# Patient Record
Sex: Female | Born: 1995 | Race: White | Hispanic: No | Marital: Single | Smoking: Current some day smoker
Health system: Southern US, Community
[De-identification: ages and names within clinical notes are randomized; demographics above are authoritative.]

## PROBLEM LIST (undated history)

## (undated) ENCOUNTER — Inpatient Hospital Stay (HOSPITAL_COMMUNITY): Payer: Self-pay

## (undated) DIAGNOSIS — F419 Anxiety disorder, unspecified: Secondary | ICD-10-CM

## (undated) DIAGNOSIS — B009 Herpesviral infection, unspecified: Secondary | ICD-10-CM

## (undated) DIAGNOSIS — F3110 Bipolar disorder, current episode manic without psychotic features, unspecified: Secondary | ICD-10-CM

## (undated) HISTORY — DX: Anxiety disorder, unspecified: F41.9

## (undated) HISTORY — DX: Bipolar disorder, current episode manic without psychotic features, unspecified: F31.10

---

## 2017-03-15 ENCOUNTER — Inpatient Hospital Stay (HOSPITAL_COMMUNITY)
Admission: AD | Admit: 2017-03-15 | Discharge: 2017-03-15 | Disposition: A | Discharge status: Home / Self Care | Payer: Medicaid Other | Source: Ambulatory Visit | Attending: Obstetrics & Gynecology | Admitting: Obstetrics & Gynecology

## 2017-03-15 ENCOUNTER — Encounter (HOSPITAL_COMMUNITY): Payer: Self-pay | Admitting: Medical

## 2017-03-15 DIAGNOSIS — R110 Nausea: Secondary | ICD-10-CM | POA: Insufficient documentation

## 2017-03-15 DIAGNOSIS — Z3201 Encounter for pregnancy test, result positive: Secondary | ICD-10-CM | POA: Insufficient documentation

## 2017-03-15 DIAGNOSIS — R1010 Upper abdominal pain, unspecified: Secondary | ICD-10-CM | POA: Diagnosis not present

## 2017-03-15 DIAGNOSIS — R1111 Vomiting without nausea: Secondary | ICD-10-CM | POA: Diagnosis not present

## 2017-03-15 LAB — POCT PREGNANCY, URINE: Preg Test, Ur: POSITIVE — AB

## 2017-03-15 MED ORDER — PROMETHAZINE HCL 12.5 MG PO TABS: 12.5000 mg | ORAL_TABLET | Freq: Four times a day (QID) | ORAL | 0 refills | Status: DC | PRN

## 2017-03-15 NOTE — Discharge Instructions (Signed)
Eating Plan for Nausea and Vomiting in Pregnancy Hyperemesis gravidarum is a severe form of morning sickness. Because this condition causes severe nausea and vomiting, it can lead to dehydration, malnutrition, and weight loss. One way to lessen the symptoms of nausea and vomiting is to follow the eating plan for hyperemesis gravidarum. It is often used along with prescribed medicines to control your symptoms. What can I do to relieve my symptoms? Listen to your body. Everyone is different and has different preferences. Find what works best for you. Take any of the following actions that are helpful to you:  Eat and drink slowly.  Eat 5-6 small meals daily instead of 3 large meals.  Eat crackers before you get out of bed in the morning.  Try having a snack in the middle of the night.  Starchy foods are usually tolerated well. Examples include cereal, toast, bread, potatoes, pasta, rice, and pretzels.  Ginger may help with nausea. Add  tsp ground ginger to hot tea or choose ginger tea.  Try drinking 100% fruit juice or an electrolyte drink. An electrolyte drink contains sodium, potassium, and chloride.  Continue to take your prenatal vitamins as told by your health care provider. If you are having trouble taking your prenatal vitamins, talk with your health care provider about different options.  Include at least 1 serving of protein with your meals and snacks. Protein options include meats or poultry, beans, nuts, eggs, and yogurt. Try eating a protein-rich snack before bed. Examples of these snacks include cheese and crackers or half of a peanut butter or [place] sandwich.  Consider eliminating foods that trigger your symptoms. These may include spicy foods, coffee, high-fat foods, very sweet foods, and acidic foods.  Try meals that have more protein combined with bland, salty, lower-fat, and dry foods, such as nuts, seeds, pretzels, crackers, and cereal.  Talk with your healthcare  provider about starting a supplement of vitamin B6.  Have fluids that are cold, clear, and carbonated or sour. Examples include lemonade, ginger ale, lemon-lime soda, ice water, and sparkling water.  Try lemon or mint tea.  Try brushing your teeth or using a mouth rinse after meals. What should I avoid to reduce my symptoms? Avoiding some of the following things may help reduce your symptoms.  Foods with strong smells. Try eating meals in well-ventilated areas that are free of odors.  Drinking water or other beverages with meals. Try not to drink anything during the 30 minutes before and after your meals.  Drinking more than 1 cup of fluid at a time. Sometimes using a straw helps.  Fried or high-fat foods, such as butter and cream sauces.  Spicy foods.  Skipping meals as best as you can. Nausea can be more intense on an empty stomach. If you cannot tolerate food at that time, do not force it. Try sucking on ice chips or other frozen items, and make up for missed calories later.  Lying down within 2 hours after eating.  Environmental triggers. These may include smoky rooms, closed spaces, rooms with strong smells, warm or humid places, overly loud and noisy rooms, and rooms with motion or flickering lights.  Quick and sudden changes in your movement. This information is not intended to replace advice given to you by your health care provider. Make sure you discuss any questions you have with your health care provider. Document Released: 08/25/2007 Document Revised: 06/26/2016 Document Reviewed: 05/28/2016 Elsevier Interactive Patient Education  2017 [employer]  For the  nausea you can take an over the counter medication known as UNISOM in combination with VITAMIN B6 50mg  twice a day. This is the same as a prescription medication known as Diclegis, and is the only known safe medication to take in early pregnancy. There are other medications available if this does now work. However,  those medications are not known to be as safe.

## 2017-03-15 NOTE — MAU Note (Signed)
Patient has had 4 positive pregnancy tests, denies vaginal bleeding, no unusual discharge, very nauseous, LMP 02/11/17, denies pain.

## 2017-03-15 NOTE — MAU Provider Note (Signed)
  History     CSN: [id number]  Arrival date and time: 03/15/17 1327    First Provider Initiated Contact with Patient 03/15/17 1349        Chief Complaint  Patient presents with  . Possible Pregnancy   HPI Ms. Jackie PlumCarley Morillo is a 21 y.o.  who presents to MAU today with complaint of nausea without vomiting. The patient also states mild intermittent associated upper abdominal pain. She denies lower abdominal pain, vaginal bleeding, discharge, fever, diarrhea or constipation today.   OB History    Gravida Para Term Preterm AB Living   1             SAB TAB Ectopic Multiple Live Births                  No past medical history on file.  No past surgical history on file.  No family history on file.  Social History  Substance Use Topics  . Smoking status: Not on file  . Smokeless tobacco: Not on file  . Alcohol use Not on file    Allergies: Allergies not on file  No prescriptions prior to admission.    Review of Systems  Constitutional: Negative for fever.  Gastrointestinal: Positive for abdominal pain and nausea. Negative for constipation, diarrhea and vomiting.  Genitourinary: Negative for vaginal bleeding and vaginal discharge.   Physical Exam   Blood pressure 111/85, pulse 75, temperature 97.8 F (36.6 C), temperature source Oral, resp. rate 16, height 5\' 7"  (1.702 m), weight 108 lb 1.9 oz (49 kg), last menstrual period 02/11/2017.  Physical Exam  Nursing note and vitals reviewed. Constitutional: She is oriented to person, place, and time. She appears well-developed and well-nourished. No distress.  HENT:  Head: Normocephalic and atraumatic.  Cardiovascular: Normal rate.   Respiratory: Effort normal.  GI: Soft. She exhibits no distension and no mass. There is no tenderness. There is no rebound and no guarding.  Neurological: She is alert and oriented to person, place, and time.  Skin: Skin is warm and dry. No erythema.  Psychiatric: She has a normal mood and  affect.    Results for orders placed or performed during the hospital encounter of 03/15/17 (from the past 24 hour(s))  Pregnancy, urine POC     Status: Abnormal   Collection Time: 03/15/17  1:53 PM  Result Value Ref Range   Preg Test, Ur POSITIVE (A) NEGATIVE    MAU Course  Procedures None  MDM UPT- negative   Assessment and Plan  A: Positive pregnancy test Nausea without vomiting  P: Discharge home Rx for Phenergan given to patient  OTC Unisom and B6 instructions given  First trimester precautions discussed Patient advised to follow-up with OB provider of choice.  Pregnancy confirmation letter given with list of OB providers Patient may return to MAU as needed or if her condition were to change or worsen   Vonzella NippleJulie Wenzel, PA-C 03/15/2017, 2:13 PM

## 2017-04-28 ENCOUNTER — Other Ambulatory Visit (HOSPITAL_COMMUNITY)
Admission: RE | Admit: 2017-04-28 | Discharge: 2017-04-28 | Disposition: A | Discharge status: Home / Self Care | Payer: Medicaid Other | Source: Ambulatory Visit | Attending: Obstetrics & Gynecology | Admitting: Obstetrics & Gynecology

## 2017-04-28 ENCOUNTER — Encounter: Payer: Self-pay | Admitting: *Deleted

## 2017-04-28 ENCOUNTER — Encounter: Payer: Self-pay | Admitting: Obstetrics & Gynecology

## 2017-04-28 ENCOUNTER — Ambulatory Visit (INDEPENDENT_AMBULATORY_CARE_PROVIDER_SITE_OTHER): Payer: Medicaid Other | Admitting: Obstetrics & Gynecology

## 2017-04-28 DIAGNOSIS — Z3400 Encounter for supervision of normal first pregnancy, unspecified trimester: Secondary | ICD-10-CM | POA: Diagnosis not present

## 2017-04-28 DIAGNOSIS — Z3401 Encounter for supervision of normal first pregnancy, first trimester: Secondary | ICD-10-CM

## 2017-04-28 NOTE — Progress Notes (Signed)
NOB here with boyfriend.  Bedside U/S  Shows IUP with FHT of 182 BPM  CRL is31.473mm and GA is 10w

## 2017-04-28 NOTE — Progress Notes (Signed)
  Subjective:    Lauren Lawson is a G1P0 5624w6d being seen today for her first obstetrical visit.  Her obstetrical history is significant for none. Patient does intend to breast feed. Pregnancy history fully reviewed.  Patient reports no complaints.  Vitals:   04/28/17 0933  BP: 107/81  Pulse: 92  Weight: 111 lb (50.3 kg)    HISTORY: OB History  Gravida Para Term Preterm AB Living  1            SAB TAB Ectopic Multiple Live Births               # Outcome Date GA Lbr Len/2nd Weight Sex Delivery Anes PTL Lv  1 Current              Past Medical History:  Diagnosis Date  . Anxiety   . Manic bipolar I disorder (HCC)    History reviewed. No pertinent surgical history. Family History  Problem Relation Age of Onset  . Hypertension Father      Exam    Uterus:     Pelvic Exam:    Perineum: No Hemorrhoids   Vulva: normal   Vagina:  normal mucosa   pH:    Cervix: anteverted   Adnexa: normal adnexa   Bony Pelvis: android  System: Breast:  normal appearance, no masses or tenderness   Skin: normal coloration and turgor, no rashes    Neurologic: oriented   Extremities: normal strength, tone, and muscle mass   HEENT PERRLA   Mouth/Teeth mucous membranes moist, pharynx normal without lesions   Neck supple   Cardiovascular: regular rate and rhythm   Respiratory:  appears well, vitals normal, no respiratory distress, acyanotic, normal RR, ear and throat exam is normal, neck free of mass or lymphadenopathy, chest clear, no wheezing, crepitations, rhonchi, normal symmetric air entry   Abdomen: soft, non-tender; bowel sounds normal; no masses,  no organomegaly   Urinary: urethral meatus normal      Assessment:    Pregnancy: G1P0 Patient Active Problem List   Diagnosis Date Noted  . Pregnancy, supervision of first, unspecified trimester 04/28/2017        Plan:     Initial labs drawn. Prenatal vitamins. Problem list reviewed and updated. Genetic Screening discussed  Quad Screen: undecided.  Ultrasound discussed; fetal survey: requested. And ordered  Follow up in 4 weeks.  Allie BossierMyra C Sutton Hirsch 04/28/2017

## 2017-04-29 LAB — CULTURE, OB URINE

## 2017-04-29 LAB — PRENATAL PROFILE (SOLSTAS)
ANTIBODY SCREEN: NEGATIVE
BASOS PCT: 0 %
Basophils Absolute: 0 cells/uL (ref 0–200)
EOS PCT: 1 %
Eosinophils Absolute: 56 cells/uL (ref 15–500)
HEMATOCRIT: 40.9 % (ref 35.0–45.0)
HEMOGLOBIN: 13.3 g/dL (ref 11.7–15.5)
HIV 1&2 Ab, 4th Generation: NONREACTIVE
Hepatitis B Surface Ag: NEGATIVE
LYMPHS ABS: 1064 {cells}/uL (ref 850–3900)
Lymphocytes Relative: 19 %
MCH: 28.9 pg (ref 27.0–33.0)
MCHC: 32.5 g/dL (ref 32.0–36.0)
MCV: 88.7 fL (ref 80.0–100.0)
MONOS PCT: 5 %
MPV: 9 fL (ref 7.5–12.5)
Monocytes Absolute: 280 cells/uL (ref 200–950)
NEUTROS ABS: 4200 {cells}/uL (ref 1500–7800)
Neutrophils Relative %: 75 %
Platelets: 236 10*3/uL (ref 140–400)
RBC: 4.61 MIL/uL (ref 3.80–5.10)
RDW: 14.2 % (ref 11.0–15.0)
Rh Type: POSITIVE
Rubella: 3.84 Index — ABNORMAL HIGH (ref <0.90)
WBC: 5.6 10*3/uL (ref 3.8–10.8)

## 2017-04-30 LAB — CYTOLOGY - PAP
Chlamydia: NEGATIVE
DIAGNOSIS: NEGATIVE
Neisseria Gonorrhea: NEGATIVE

## 2017-05-26 ENCOUNTER — Ambulatory Visit (INDEPENDENT_AMBULATORY_CARE_PROVIDER_SITE_OTHER): Payer: Medicaid Other | Admitting: Advanced Practice Midwife

## 2017-05-26 VITALS — BP 108/69 | HR 78 | Wt 115.0 lb

## 2017-05-26 DIAGNOSIS — H66001 Acute suppurative otitis media without spontaneous rupture of ear drum, right ear: Secondary | ICD-10-CM

## 2017-05-26 DIAGNOSIS — F419 Anxiety disorder, unspecified: Secondary | ICD-10-CM

## 2017-05-26 DIAGNOSIS — Z3A18 18 weeks gestation of pregnancy: Secondary | ICD-10-CM

## 2017-05-26 DIAGNOSIS — Z3400 Encounter for supervision of normal first pregnancy, unspecified trimester: Secondary | ICD-10-CM

## 2017-05-26 DIAGNOSIS — O99342 Other mental disorders complicating pregnancy, second trimester: Secondary | ICD-10-CM

## 2017-05-26 DIAGNOSIS — Z1379 Encounter for other screening for genetic and chromosomal anomalies: Secondary | ICD-10-CM

## 2017-05-26 DIAGNOSIS — R070 Pain in throat: Secondary | ICD-10-CM

## 2017-05-26 DIAGNOSIS — Z3402 Encounter for supervision of normal first pregnancy, second trimester: Secondary | ICD-10-CM

## 2017-05-26 MED ORDER — AMOXICILLIN 500 MG PO CAPS: 500.0000 mg | ORAL_CAPSULE | Freq: Two times a day (BID) | ORAL | 0 refills | Status: AC

## 2017-05-26 MED ORDER — AMOXICILLIN 500 MG PO CAPS: 500.0000 mg | ORAL_CAPSULE | Freq: Two times a day (BID) | ORAL | 0 refills | Status: DC
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
## 2017-05-26 NOTE — Addendum Note (Signed)
Addended by: Sharen CounterLEFTWICH-KIRBY, Koua Deeg A on: 05/26/2017 11:09 AM   Modules accepted: Orders

## 2017-05-26 NOTE — Patient Instructions (Signed)

## 2017-06-30 ENCOUNTER — Other Ambulatory Visit: Payer: Self-pay | Admitting: Advanced Practice Midwife

## 2017-06-30 ENCOUNTER — Ambulatory Visit (HOSPITAL_COMMUNITY)
Admission: RE | Admit: 2017-06-30 | Discharge: 2017-06-30 | Disposition: A | Discharge status: Home / Self Care | Payer: Medicaid Other | Source: Ambulatory Visit | Attending: Advanced Practice Midwife | Admitting: Advanced Practice Midwife

## 2017-06-30 DIAGNOSIS — Z3A19 19 weeks gestation of pregnancy: Secondary | ICD-10-CM | POA: Diagnosis not present

## 2017-06-30 DIAGNOSIS — Z3689 Encounter for other specified antenatal screening: Secondary | ICD-10-CM | POA: Diagnosis not present

## 2017-06-30 DIAGNOSIS — Z1379 Encounter for other screening for genetic and chromosomal anomalies: Secondary | ICD-10-CM

## 2017-06-30 DIAGNOSIS — Z3A18 18 weeks gestation of pregnancy: Secondary | ICD-10-CM

## 2017-07-04 ENCOUNTER — Ambulatory Visit (INDEPENDENT_AMBULATORY_CARE_PROVIDER_SITE_OTHER): Payer: Medicaid Other | Admitting: Certified Nurse Midwife

## 2017-07-04 ENCOUNTER — Encounter (INDEPENDENT_AMBULATORY_CARE_PROVIDER_SITE_OTHER): Payer: Self-pay

## 2017-07-04 VITALS — BP 101/69 | HR 83 | Wt 119.0 lb

## 2017-07-04 DIAGNOSIS — F32A Depression, unspecified: Secondary | ICD-10-CM

## 2017-07-04 DIAGNOSIS — F411 Generalized anxiety disorder: Secondary | ICD-10-CM

## 2017-07-04 DIAGNOSIS — O99342 Other mental disorders complicating pregnancy, second trimester: Secondary | ICD-10-CM

## 2017-07-04 DIAGNOSIS — Z3402 Encounter for supervision of normal first pregnancy, second trimester: Secondary | ICD-10-CM

## 2017-07-04 DIAGNOSIS — R319 Hematuria, unspecified: Secondary | ICD-10-CM

## 2017-07-04 DIAGNOSIS — Z3400 Encounter for supervision of normal first pregnancy, unspecified trimester: Secondary | ICD-10-CM

## 2017-07-04 DIAGNOSIS — O99340 Other mental disorders complicating pregnancy, unspecified trimester: Secondary | ICD-10-CM | POA: Insufficient documentation

## 2017-07-04 DIAGNOSIS — F329 Major depressive disorder, single episode, unspecified: Secondary | ICD-10-CM

## 2017-07-04 DIAGNOSIS — F419 Anxiety disorder, unspecified: Secondary | ICD-10-CM | POA: Insufficient documentation

## 2017-07-04 MED ORDER — SERTRALINE HCL 25 MG PO TABS: 25.0000 mg | ORAL_TABLET | Freq: Every day | ORAL | 1 refills | Status: DC

## 2017-07-04 NOTE — Progress Notes (Signed)
Urine-mod blood

## 2017-07-04 NOTE — Addendum Note (Signed)
Addended by: Granville Lewis on: 07/04/2017 11:52 AM   Modules accepted: Orders

## 2017-07-04 NOTE — Progress Notes (Signed)
Subjective:  Lauren Lawson is a 21 y.o. G1P0 at [redacted]w[redacted]d being seen today for ongoing prenatal care.  She is currently monitored for the following issues for this low-risk pregnancy and has Pregnancy, supervision of first, unspecified trimester on her problem list.  Patient reports onset of anxiety and depression. Sx occur most days. Had panic attack yesterday. Cries at times. Increased stress at home and relationship with S.O. Denies physical or verbal abuse. No SI/HI. Hx of anxiety in past, was on meds years ago. Worried that her mood will affect the baby. States "I just want to be happy".  Contractions: Not present. Vag. Bleeding: None.  Movement: Present. Denies leaking of fluid.   The following portions of the patient's history were reviewed and updated as appropriate: allergies, current medications, past family history, past medical history, past social history, past surgical history and problem list. Problem list updated.  Objective:   Vitals:   07/04/17 0859  BP: 101/69  Pulse: 83  Weight: 119 lb (54 kg)    Fetal Status: Fetal Heart Rate (bpm): 145 Fundal Height: 19 cm Movement: Present     General:  Alert, oriented and cooperative. Patient is in no acute distress.  Skin: Skin is warm and dry. No rash noted.   Cardiovascular: Normal heart rate noted  Respiratory: Normal respiratory effort, no problems with respiration noted  Abdomen: Soft, gravid, appropriate for gestational age. Pain/Pressure: Absent     Pelvic: Vag. Bleeding: None Vag D/C Character: Thin   Cervical exam deferred        Extremities: Normal range of motion.  Edema: None  Mental Status: Normal mood and affect. Normal behavior. Normal judgment and thought content.   Urinalysis: Urine Protein: Negative Urine Glucose: Negative  Assessment and Plan:  Pregnancy: G1P0 at [redacted]w[redacted]d  1. Encounter for supervision of normal first pregnancy in second trimester - AFP, Quad Screen  2. Pregnancy, supervision of first,  unspecified trimester - schedule f/u US to complete anatomy  3. Generalized anxiety disorder - agrees to co-therapy - will provide therapist info - notify MD for worsening sx - sertraline (ZOLOFT) 25 MG tablet; Take 1 tablet (25 mg total) by mouth daily.  Dispense: 60 tablet; Refill: 1  4. Depression affecting pregnancy - sertraline (ZOLOFT) 25 MG tablet; Take 1 tablet (25 mg total) by mouth daily.  Dispense: 60 tablet; Refill: 1  Preterm labor symptoms and general obstetric precautions including but not limited to vaginal bleeding, contractions, leaking of fluid and fetal movement were reviewed in detail with the patient. Please refer to After Visit Summary for other counseling recommendations.  Return in about 5 weeks (around 08/08/2017).   Donette Larry, CNM

## 2017-07-04 NOTE — Addendum Note (Signed)
Addended by: Granville Lewis on: 07/04/2017 09:58 AM   Modules accepted: Orders

## 2017-07-04 NOTE — Addendum Note (Signed)
Addended by: Donette Larry E on: 07/04/2017 09:40 AM   Modules accepted: Orders

## 2017-07-07 LAB — AFP, QUAD SCREEN
AFP: 41.5 ng/mL
Age Alone: 1:1140 {titer}
Curr Gest Age: 19.6 weeks
Down Syndrome Scr Risk Est: 1:501 {titer}
HCG, Total: 42.84 IU/mL
INH: 333.1 pg/mL
Interpretation-AFP: NEGATIVE
MoM for AFP: 0.67
MoM for INH: 1.46
MoM for hCG: 1.67
Open Spina bifida: NEGATIVE
Osb Risk: <1:27300 {titer}
Tri 18 Scr Risk Est: NEGATIVE
Trisomy 18 (Edward) Syndrome Interp.: 1:60700 {titer}
uE3 Mom: 0.95
uE3 Value: 1.57 ng/mL

## 2017-07-08 ENCOUNTER — Telehealth: Payer: Self-pay

## 2017-07-08 ENCOUNTER — Telehealth: Payer: Self-pay | Admitting: *Deleted

## 2017-07-08 DIAGNOSIS — N390 Urinary tract infection, site not specified: Secondary | ICD-10-CM

## 2017-07-08 LAB — CULTURE, URINE COMPREHENSIVE

## 2017-07-08 MED ORDER — NITROFURANTOIN MONOHYD MACRO 100 MG PO CAPS: 100.0000 mg | ORAL_CAPSULE | Freq: Two times a day (BID) | ORAL | 0 refills | Status: DC

## 2017-07-08 NOTE — Telephone Encounter (Addendum)
-----   Message from Hurshel Party, CNM sent at 05/26/2017 11:04 AM EDT ----- Please give her [facility] info:  [facility] Address: [address] [place], [place], [place] [address] Phone: [phone]  Thank you.

## 2017-07-08 NOTE — Telephone Encounter (Signed)
Called patient and given info for behavioral health practices in [place] and [place].

## 2017-07-08 NOTE — Telephone Encounter (Signed)
Pt notified of positive urine culture and RX for Macrobid was sent to [facility] on [address] per protocol.

## 2017-07-24 ENCOUNTER — Encounter (HOSPITAL_COMMUNITY): Payer: Self-pay | Admitting: *Deleted

## 2017-07-24 ENCOUNTER — Inpatient Hospital Stay (HOSPITAL_COMMUNITY)
Admission: AD | Admit: 2017-07-24 | Discharge: 2017-07-24 | Disposition: A | Discharge status: Home / Self Care | Payer: Medicaid Other | Source: Ambulatory Visit | Attending: Obstetrics and Gynecology | Admitting: Obstetrics and Gynecology

## 2017-07-24 DIAGNOSIS — O99891 Other specified diseases and conditions complicating pregnancy: Secondary | ICD-10-CM

## 2017-07-24 DIAGNOSIS — Z3A22 22 weeks gestation of pregnancy: Secondary | ICD-10-CM | POA: Diagnosis not present

## 2017-07-24 DIAGNOSIS — O26892 Other specified pregnancy related conditions, second trimester: Secondary | ICD-10-CM | POA: Insufficient documentation

## 2017-07-24 DIAGNOSIS — O2342 Unspecified infection of urinary tract in pregnancy, second trimester: Secondary | ICD-10-CM

## 2017-07-24 DIAGNOSIS — M549 Dorsalgia, unspecified: Secondary | ICD-10-CM

## 2017-07-24 DIAGNOSIS — O9989 Other specified diseases and conditions complicating pregnancy, childbirth and the puerperium: Secondary | ICD-10-CM

## 2017-07-24 LAB — URINALYSIS, ROUTINE W REFLEX MICROSCOPIC
Bilirubin Urine: NEGATIVE
GLUCOSE, UA: NEGATIVE mg/dL
Ketones, ur: NEGATIVE mg/dL
Nitrite: NEGATIVE
PROTEIN: NEGATIVE mg/dL
SPECIFIC GRAVITY, URINE: 1.012 (ref 1.005–1.030)
pH: 6 (ref 5.0–8.0)

## 2017-07-24 MED ORDER — CEPHALEXIN 500 MG PO CAPS
500.0000 mg | ORAL_CAPSULE | Freq: Four times a day (QID) | ORAL | 0 refills | Status: DC
Start: 2017-07-24 — End: 2017-08-08

## 2017-07-24 MED ORDER — IBUPROFEN 600 MG PO TABS
600.0000 mg | ORAL_TABLET | Freq: Once | ORAL | Status: AC
  Administered 2017-07-24: 600 mg via ORAL
  Filled 2017-07-24: qty 1

## 2017-07-24 NOTE — MAU Provider Note (Signed)
History     CSN: [id number]  Arrival date and time: 07/24/17 1035   First Provider Initiated Contact with Patient 07/24/17 1141      Chief Complaint  Patient presents with  . Back Pain   HPI  [name] is a  21 y.o. female G1P0 here with back pain and abdominal pain. The symptoms started yesterday. The pain in her abdomen is described as cramping. The pain is located in the lower part of her abdomen.  She was recently treated for a UTI a couple of weeks ago. States she was given medication however did not take all of the medication as prescribed.    OB History    Gravida Para Term Preterm AB Living   1             SAB TAB Ectopic Multiple Live Births                  Past Medical History:  Diagnosis Date  . Anxiety   . Manic bipolar I disorder (HCC)     History reviewed. No pertinent surgical history.  Family History  Problem Relation Age of Onset  . Hypertension Father     Social History  Substance Use Topics  . Smoking status: Former Games developer  . Smokeless tobacco: Former Neurosurgeon  . Alcohol use No    Allergies: No Known Allergies  Prescriptions Prior to Admission  Medication Sig Dispense Refill Last Dose  . acetaminophen (TYLENOL) 500 MG tablet Take 500 mg by mouth every 6 (six) hours as needed for mild pain or headache.   07/23/2017 at Unknown time  . calcium carbonate (TUMS - DOSED IN MG ELEMENTAL CALCIUM) 500 MG chewable tablet Chew 1 tablet by mouth 2 (two) times daily as needed for indigestion or heartburn.   Past Month at Unknown time  . nitrofurantoin, macrocrystal-monohydrate, (MACROBID) 100 MG capsule Take 1 capsule (100 mg total) by mouth 2 (two) times daily. 14 capsule 0 07/23/2017 at Unknown time  . Prenatal Vit-Fe Fumarate-FA (MULTIVITAMIN-PRENATAL) 27-0.8 MG TABS tablet Take 1 tablet by mouth daily at 12 noon.   Past Month at Unknown time  . sertraline (ZOLOFT) 25 MG tablet Take 1 tablet (25 mg total) by mouth daily. 60 tablet 1 Past Week at  Unknown time  . promethazine (PHENERGAN) 12.5 MG tablet Take 1 tablet (12.5 mg total) by mouth every 6 (six) hours as needed for nausea or vomiting. (Patient not taking: Reported on 07/04/2017) 30 tablet 0 Not Taking   Results for orders placed or performed during the hospital encounter of 07/24/17 (from the past 48 hour(s))  Urinalysis, Routine w reflex microscopic     Status: Abnormal   Collection Time: 07/24/17 10:40 AM  Result Value Ref Range   Color, Urine YELLOW YELLOW   APPearance HAZY (A) CLEAR   Specific Gravity, Urine 1.012 1.005 - 1.030   pH 6.0 5.0 - 8.0   Glucose, UA NEGATIVE NEGATIVE mg/dL   Hgb urine dipstick SMALL (A) NEGATIVE   Bilirubin Urine NEGATIVE NEGATIVE   Ketones, ur NEGATIVE NEGATIVE mg/dL   Protein, ur NEGATIVE NEGATIVE mg/dL   Nitrite NEGATIVE NEGATIVE   Leukocytes, UA LARGE (A) NEGATIVE   RBC / HPF 6-30 0 - 5 RBC/hpf   WBC, UA 6-30 0 - 5 WBC/hpf   Bacteria, UA RARE (A) NONE SEEN   Squamous Epithelial / LPF 0-5 (A) NONE SEEN   Mucus PRESENT     Review of Systems  Constitutional: Negative for fever.  Gastrointestinal: Positive for abdominal pain.  Genitourinary: Positive for dysuria and urgency.  Musculoskeletal: Positive for back pain.   Physical Exam   Blood pressure 121/79, pulse (!) 116, temperature 98.2 F (36.8 C), temperature source Oral, resp. rate 15, height 5\' 7"  (1.702 m), weight 124 lb (56.2 kg), last menstrual period 02/11/2017, SpO2 100 %.  Physical Exam  Constitutional: She is oriented to person, place, and time. She appears well-developed and well-nourished. No distress.  HENT:  Head: Normocephalic.  Eyes: Pupils are equal, round, and reactive to light.  GI: Soft. Normal appearance. There is tenderness in the right lower quadrant, suprapubic area and left lower quadrant. There is no rigidity, no guarding and no CVA tenderness.  Genitourinary:  Genitourinary Comments: Dilation: Closed Effacement (%): Thick Cervical Position:  Posterior Exam by:: Venia CarbonJennifer Rasch, NP  Neurological: She is alert and oriented to person, place, and time.  Skin: Skin is warm. She is not diaphoretic.  Psychiatric: Her behavior is normal.   MAU Course  Procedures  None  MDM  Urine culture pending Ibuprofen 600 mg PO given. Pain down from 6/10 to 3/10  Assessment and Plan   A:  1. Urinary tract infection in mother during second trimester of pregnancy   2. Back pain affecting pregnancy in second trimester     P:  Discharge home in stable condition Rx: Keflex; discussed the importance of taking your medication Urine culture pending Ok to use ibuprofen sparling prior to 28 weeks only  Strict return precautions   Rasch, Harolyn RutherfordJennifer I, NP 07/24/2017 1:33 PM

## 2017-07-24 NOTE — Discharge Instructions (Signed)

## 2017-07-24 NOTE — MAU Note (Signed)
Pt reports bilateral mid back pain since yesterday, today the pain is radiating to her lower abd. Denies bleeding.

## 2017-07-25 LAB — CULTURE, OB URINE
CULTURE: NO GROWTH
Special Requests: NORMAL

## 2017-08-08 ENCOUNTER — Ambulatory Visit (INDEPENDENT_AMBULATORY_CARE_PROVIDER_SITE_OTHER): Payer: Medicaid Other | Admitting: Certified Nurse Midwife

## 2017-08-08 VITALS — BP 100/70 | HR 88 | Wt 128.0 lb

## 2017-08-08 DIAGNOSIS — O9989 Other specified diseases and conditions complicating pregnancy, childbirth and the puerperium: Secondary | ICD-10-CM

## 2017-08-08 DIAGNOSIS — O26899 Other specified pregnancy related conditions, unspecified trimester: Secondary | ICD-10-CM

## 2017-08-08 DIAGNOSIS — Z3400 Encounter for supervision of normal first pregnancy, unspecified trimester: Secondary | ICD-10-CM

## 2017-08-08 DIAGNOSIS — M549 Dorsalgia, unspecified: Secondary | ICD-10-CM

## 2017-08-08 DIAGNOSIS — O99340 Other mental disorders complicating pregnancy, unspecified trimester: Principal | ICD-10-CM

## 2017-08-08 DIAGNOSIS — O99342 Other mental disorders complicating pregnancy, second trimester: Secondary | ICD-10-CM

## 2017-08-08 DIAGNOSIS — O26892 Other specified pregnancy related conditions, second trimester: Secondary | ICD-10-CM

## 2017-08-08 DIAGNOSIS — F419 Anxiety disorder, unspecified: Secondary | ICD-10-CM

## 2017-08-08 DIAGNOSIS — O2342 Unspecified infection of urinary tract in pregnancy, second trimester: Secondary | ICD-10-CM

## 2017-08-08 DIAGNOSIS — R102 Pelvic and perineal pain: Secondary | ICD-10-CM

## 2017-08-08 MED ORDER — COMFORT FIT MATERNITY SUPP MED MISC
1.0000 | Freq: Every day | 0 refills | Status: DC
Start: 2017-08-08 — End: 2017-08-29

## 2017-08-08 NOTE — Progress Notes (Signed)
Subjective:  Lauren Lawson is a 21 y.o. G1P0 at [redacted]w[redacted]d being seen today for ongoing prenatal care.  She is currently monitored for the following issues for this low-risk pregnancy and has Pregnancy, supervision of first, unspecified trimester; Anxiety disorder affecting pregnancy, antepartum; and Depression affecting pregnancy on her problem list.  Patient reports backache and pulling pain in LLQ> intermittent, worse with position changes.  Contractions: Not present. Vag. Bleeding: None.  Movement: Present. Denies leaking of fluid.   The following portions of the patient's history were reviewed and updated as appropriate: allergies, current medications, past family history, past medical history, past social history, past surgical history and problem list. Problem list updated.  Objective:   Vitals:   08/08/17 1010  BP: 100/70  Pulse: 88  Weight: 128 lb (58.1 kg)    Fetal Status: Fetal Heart Rate (bpm): 139 Fundal Height: 24 cm Movement: Present  Presentation: Undeterminable  General:  Alert, oriented and cooperative. Patient is in no acute distress.  Skin: Skin is warm and dry. No rash noted.   Cardiovascular: Normal heart rate noted  Respiratory: Normal respiratory effort, no problems with respiration noted  Abdomen: Soft, gravid, appropriate for gestational age. Pain/Pressure: Absent     Pelvic: Vag. Bleeding: None Vag D/C Character: Thin   Cervical exam deferred        Extremities: Normal range of motion.  Edema: None  Mental Status: Normal mood and affect. Normal behavior. Normal judgment and thought content.   Urinalysis: Urine Protein: Negative Urine Glucose: Negative  Assessment and Plan:  Pregnancy: G1P0 at [redacted]w[redacted]d  1. Pregnancy, supervision of first, unspecified trimester - f/u US for incomplete anatomy  2. Anxiety disorder affecting pregnancy, antepartum - feeling better, taking Zoloft, started working and staying busy - hasn't started therapy, was unaware of upcoming appt  at [facility]  3. Pain of round ligament affecting pregnancy, antepartum - comfort measures - Elastic Bandages & Supports (COMFORT FIT MATERNITY SUPP MED) MISC; 1 Device by Does not apply route daily.  Dispense: 1 each; Refill: 0  4. Back pain during pregnancy - discussed good body mechanics, no lifting >20 lbs - Elastic Bandages & Supports (COMFORT FIT MATERNITY SUPP MED) MISC; 1 Device by Does not apply route daily.  Dispense: 1 each; Refill: 0  5. Urinary tract infection in mother during second trimester of pregnancy - finished Keflex - Culture, OB Urine  Preterm labor symptoms and general obstetric precautions including but not limited to vaginal bleeding, contractions, leaking of fluid and fetal movement were reviewed in detail with the patient. Please refer to After Visit Summary for other counseling recommendations.  Return in about 4 weeks (around 09/05/2017).   Donette Larry, CNM

## 2017-08-08 NOTE — Addendum Note (Signed)
Addended by: Granville Lewis on: 08/08/2017 10:59 AM   Modules accepted: Orders

## 2017-08-09 LAB — CULTURE, URINE COMPREHENSIVE
MICRO NUMBER: 81078713
SPECIMEN QUALITY: ADEQUATE

## 2017-08-14 ENCOUNTER — Institutional Professional Consult (permissible substitution): Payer: Medicaid Other

## 2017-08-19 ENCOUNTER — Other Ambulatory Visit: Payer: Self-pay | Admitting: Certified Nurse Midwife

## 2017-08-19 ENCOUNTER — Ambulatory Visit (HOSPITAL_COMMUNITY)
Admission: RE | Admit: 2017-08-19 | Discharge: 2017-08-19 | Disposition: A | Discharge status: Home / Self Care | Payer: Medicaid Other | Source: Ambulatory Visit | Attending: Certified Nurse Midwife | Admitting: Certified Nurse Midwife

## 2017-08-19 DIAGNOSIS — IMO0002 Reserved for concepts with insufficient information to code with codable children: Secondary | ICD-10-CM

## 2017-08-19 DIAGNOSIS — Z3A27 27 weeks gestation of pregnancy: Secondary | ICD-10-CM

## 2017-08-19 DIAGNOSIS — Z0489 Encounter for examination and observation for other specified reasons: Secondary | ICD-10-CM

## 2017-08-19 DIAGNOSIS — Z3400 Encounter for supervision of normal first pregnancy, unspecified trimester: Secondary | ICD-10-CM

## 2017-08-19 DIAGNOSIS — Z3402 Encounter for supervision of normal first pregnancy, second trimester: Secondary | ICD-10-CM

## 2017-08-19 DIAGNOSIS — Z362 Encounter for other antenatal screening follow-up: Secondary | ICD-10-CM | POA: Diagnosis present

## 2017-08-29 ENCOUNTER — Encounter (HOSPITAL_COMMUNITY): Payer: Self-pay | Admitting: *Deleted

## 2017-08-29 ENCOUNTER — Inpatient Hospital Stay (HOSPITAL_COMMUNITY)
Admission: AD | Admit: 2017-08-29 | Discharge: 2017-08-29 | Disposition: A | Discharge status: Home / Self Care | Payer: Medicaid Other | Source: Ambulatory Visit | Attending: Obstetrics and Gynecology | Admitting: Obstetrics and Gynecology

## 2017-08-29 DIAGNOSIS — F329 Major depressive disorder, single episode, unspecified: Secondary | ICD-10-CM | POA: Insufficient documentation

## 2017-08-29 DIAGNOSIS — R102 Pelvic and perineal pain: Secondary | ICD-10-CM | POA: Diagnosis not present

## 2017-08-29 DIAGNOSIS — Z3A27 27 weeks gestation of pregnancy: Secondary | ICD-10-CM | POA: Diagnosis not present

## 2017-08-29 DIAGNOSIS — O26892 Other specified pregnancy related conditions, second trimester: Secondary | ICD-10-CM

## 2017-08-29 DIAGNOSIS — Z87891 Personal history of nicotine dependence: Secondary | ICD-10-CM | POA: Diagnosis not present

## 2017-08-29 DIAGNOSIS — O212 Late vomiting of pregnancy: Secondary | ICD-10-CM | POA: Diagnosis present

## 2017-08-29 DIAGNOSIS — A084 Viral intestinal infection, unspecified: Secondary | ICD-10-CM | POA: Diagnosis not present

## 2017-08-29 DIAGNOSIS — M549 Dorsalgia, unspecified: Secondary | ICD-10-CM | POA: Diagnosis not present

## 2017-08-29 DIAGNOSIS — O99340 Other mental disorders complicating pregnancy, unspecified trimester: Secondary | ICD-10-CM

## 2017-08-29 DIAGNOSIS — O9989 Other specified diseases and conditions complicating pregnancy, childbirth and the puerperium: Secondary | ICD-10-CM

## 2017-08-29 DIAGNOSIS — R197 Diarrhea, unspecified: Secondary | ICD-10-CM

## 2017-08-29 DIAGNOSIS — F419 Anxiety disorder, unspecified: Secondary | ICD-10-CM | POA: Insufficient documentation

## 2017-08-29 DIAGNOSIS — O99342 Other mental disorders complicating pregnancy, second trimester: Secondary | ICD-10-CM | POA: Insufficient documentation

## 2017-08-29 DIAGNOSIS — O99612 Diseases of the digestive system complicating pregnancy, second trimester: Secondary | ICD-10-CM | POA: Diagnosis not present

## 2017-08-29 DIAGNOSIS — Z3400 Encounter for supervision of normal first pregnancy, unspecified trimester: Secondary | ICD-10-CM

## 2017-08-29 DIAGNOSIS — O26899 Other specified pregnancy related conditions, unspecified trimester: Secondary | ICD-10-CM

## 2017-08-29 DIAGNOSIS — F32A Depression, unspecified: Secondary | ICD-10-CM

## 2017-08-29 LAB — COMPREHENSIVE METABOLIC PANEL
ALK PHOS: 81 U/L (ref 38–126)
ALT: 13 U/L — AB (ref 14–54)
AST: 20 U/L (ref 15–41)
Albumin: 3.1 g/dL — ABNORMAL LOW (ref 3.5–5.0)
Anion gap: 7 (ref 5–15)
BUN: 9 mg/dL (ref 6–20)
CALCIUM: 8.4 mg/dL — AB (ref 8.9–10.3)
CO2: 20 mmol/L — AB (ref 22–32)
CREATININE: 0.4 mg/dL — AB (ref 0.44–1.00)
Chloride: 105 mmol/L (ref 101–111)
GFR calc non Af Amer: >60 mL/min (ref >60)
GLUCOSE: 105 mg/dL — AB (ref 65–99)
Potassium: 3.5 mmol/L (ref 3.5–5.1)
SODIUM: 132 mmol/L — AB (ref 135–145)
Total Bilirubin: 0.6 mg/dL (ref 0.3–1.2)
Total Protein: 6 g/dL — ABNORMAL LOW (ref 6.5–8.1)

## 2017-08-29 LAB — CBC
HCT: 32.2 % — ABNORMAL LOW (ref 36.0–46.0)
Hemoglobin: 10.5 g/dL — ABNORMAL LOW (ref 12.0–15.0)
MCH: 27.6 pg (ref 26.0–34.0)
MCHC: 32.6 g/dL (ref 30.0–36.0)
MCV: 84.5 fL (ref 78.0–100.0)
PLATELETS: 184 10*3/uL (ref 150–400)
RBC: 3.81 MIL/uL — AB (ref 3.87–5.11)
RDW: 13.6 % (ref 11.5–15.5)
WBC: 8.5 10*3/uL (ref 4.0–10.5)

## 2017-08-29 LAB — URINALYSIS, ROUTINE W REFLEX MICROSCOPIC
BILIRUBIN URINE: NEGATIVE
Glucose, UA: NEGATIVE mg/dL
Hgb urine dipstick: NEGATIVE
KETONES UR: 15 mg/dL — AB
Leukocytes, UA: NEGATIVE
Nitrite: NEGATIVE
PH: 6 (ref 5.0–8.0)
Protein, ur: NEGATIVE mg/dL
Specific Gravity, Urine: >1.03 — ABNORMAL HIGH (ref 1.005–1.030)

## 2017-08-29 MED ORDER — DEXTROSE 5 % IN LACTATED RINGERS IV BOLUS
1000.0000 mL | Freq: Once | INTRAVENOUS | Status: AC
  Administered 2017-08-29: 1000 mL via INTRAVENOUS

## 2017-08-29 MED ORDER — COMFORT FIT MATERNITY SUPP MED MISC: 1.0000 | Freq: Every day | 0 refills | Status: DC

## 2017-08-29 MED ORDER — ONDANSETRON HCL 4 MG PO TABS: 4.0000 mg | ORAL_TABLET | Freq: Three times a day (TID) | ORAL | 0 refills | Status: DC | PRN

## 2017-08-29 MED ORDER — ONDANSETRON HCL 4 MG/2ML IJ SOLN
4.0000 mg | Freq: Once | INTRAMUSCULAR | Status: AC
Start: 2017-08-29 — End: 2017-08-29
  Administered 2017-08-29: 4 mg via INTRAVENOUS
  Filled 2017-08-29: qty 2

## 2017-08-29 NOTE — MAU Note (Signed)
Urine in lab 

## 2017-08-29 NOTE — MAU Note (Signed)
Pt c/o having liquid diarrhea x 4 days. Has vomited twice since yesterday. Went to [facility] but was sent here for further eval due to pregnancy and possible dehydration. C/O cramping with diarrhea.

## 2017-08-29 NOTE — MAU Provider Note (Signed)
Chief Complaint:  Diarrhea   None     HPI: Lauren Lawson is a 21 y.o. G1P0 at [redacted]w[redacted]d who presents to maternity admissions reporting onset of n/v/d 4 days ago.  She reports nausea with emesis x 3 daily and diarrhea 4-10 times daily since onset. Diarrhea was watery stool at onset and is now mostly clear water only.  She is trying to eat and drink but this is difficult with her symptoms.  She has not tried any medications. The symptoms are associated with intermittent back pain, 3-4 times per day that is severe. There is no abdominal pain. She reports good fetal movement, denies LOF, vaginal bleeding, vaginal itching/burning, urinary symptoms, h/a, dizziness,  or fever/chills.    HPI  Past Medical History: Past Medical History:  Diagnosis Date  . Anxiety   . Manic bipolar I disorder (HCC)     Past obstetric history: OB History  Gravida Para Term Preterm AB Living  1            SAB TAB Ectopic Multiple Live Births               # Outcome Date GA Lbr Len/2nd Weight Sex Delivery Anes PTL Lv  1 Current               Past Surgical History: History reviewed. No pertinent surgical history.  Family History: Family History  Problem Relation Age of Onset  . Hypertension Father     Social History: Social History  Substance Use Topics  . Smoking status: Former Games developer  . Smokeless tobacco: Former Neurosurgeon  . Alcohol use No    Allergies: No Known Allergies  Meds:  Prescriptions Prior to Admission  Medication Sig Dispense Refill Last Dose  . acetaminophen (TYLENOL) 500 MG tablet Take 500 mg by mouth every 6 (six) hours as needed for mild pain or headache.   Past Month at Unknown time  . Prenatal Vit-Fe Fumarate-FA (MULTIVITAMIN-PRENATAL) 27-0.8 MG TABS tablet Take 1 tablet by mouth daily at 12 noon.   Past Week at Unknown time  . calcium carbonate (TUMS - DOSED IN MG ELEMENTAL CALCIUM) 500 MG chewable tablet Chew 1 tablet by mouth 2 (two) times daily as needed for indigestion or  heartburn.   prn  . promethazine (PHENERGAN) 12.5 MG tablet Take 1 tablet (12.5 mg total) by mouth every 6 (six) hours as needed for nausea or vomiting. 30 tablet 0 Taking  . sertraline (ZOLOFT) 25 MG tablet Take 1 tablet (25 mg total) by mouth daily. 60 tablet 1 Taking  . [DISCONTINUED] Elastic Bandages & Supports (COMFORT FIT MATERNITY SUPP MED) MISC 1 Device by Does not apply route daily. 1 each 0     ROS:  Review of Systems  Constitutional: Negative for chills, fatigue and fever.  Eyes: Negative for visual disturbance.  Respiratory: Negative for shortness of breath.   Cardiovascular: Negative for chest pain.  Gastrointestinal: Positive for nausea and vomiting. Negative for abdominal pain.  Genitourinary: Negative for difficulty urinating, dysuria, flank pain, pelvic pain, vaginal bleeding, vaginal discharge and vaginal pain.  Neurological: Negative for dizziness and headaches.  Psychiatric/Behavioral: Negative.      I have reviewed patient's Past Medical Hx, Surgical Hx, Family Hx, Social Hx, medications and allergies.   Physical Exam   Patient Vitals for the past 24 hrs:  BP Temp Temp src Pulse Resp Height Weight  08/29/17 1810 115/71 - - 83 18 - -  08/29/17 1539 117/79 98 F (36.7 C) Oral  88 18 5\' 7"  (1.702 m) 133 lb (60.3 kg)   Constitutional: Well-developed, well-nourished female in no acute distress.  Cardiovascular: normal rate Respiratory: normal effort GI: Abd soft, non-tender, gravid appropriate for gestational age.  MS: Extremities nontender, no edema, normal ROM Neurologic: Alert and oriented x 4.  GU: Neg CVAT.      FHT:  Baseline 130 , moderate variability, accelerations present, no decelerations Contractions: rare, mild to palpation   Labs: Results for orders placed or performed during the hospital encounter of 08/29/17 (from the past 24 hour(s))  Urinalysis, Routine w reflex microscopic     Status: Abnormal   Collection Time: 08/29/17  3:04 PM  Result  Value Ref Range   Color, Urine AMBER (A) YELLOW   APPearance CLOUDY (A) CLEAR   Specific Gravity, Urine >1.030 (H) 1.005 - 1.030   pH 6.0 5.0 - 8.0   Glucose, UA NEGATIVE NEGATIVE mg/dL   Hgb urine dipstick NEGATIVE NEGATIVE   Bilirubin Urine NEGATIVE NEGATIVE   Ketones, ur 15 (A) NEGATIVE mg/dL   Protein, ur NEGATIVE NEGATIVE mg/dL   Nitrite NEGATIVE NEGATIVE   Leukocytes, UA NEGATIVE NEGATIVE  Comprehensive metabolic panel     Status: Abnormal   Collection Time: 08/29/17  4:36 PM  Result Value Ref Range   Sodium 132 (L) 135 - 145 mmol/L   Potassium 3.5 3.5 - 5.1 mmol/L   Chloride 105 101 - 111 mmol/L   CO2 20 (L) 22 - 32 mmol/L   Glucose, Bld 105 (H) 65 - 99 mg/dL   BUN 9 6 - 20 mg/dL   Creatinine, Ser 4.130.40 (L) 0.44 - 1.00 mg/dL   Calcium 8.4 (L) 8.9 - 10.3 mg/dL   Total Protein 6.0 (L) 6.5 - 8.1 g/dL   Albumin 3.1 (L) 3.5 - 5.0 g/dL   AST 20 15 - 41 U/L   ALT 13 (L) 14 - 54 U/L   Alkaline Phosphatase 81 38 - 126 U/L   Total Bilirubin 0.6 0.3 - 1.2 mg/dL   GFR calc non Af Amer >60 >60 mL/min   GFR calc Af Amer >60 >60 mL/min   Anion gap 7 5 - 15  CBC     Status: Abnormal   Collection Time: 08/29/17  4:36 PM  Result Value Ref Range   WBC 8.5 4.0 - 10.5 K/uL   RBC 3.81 (L) 3.87 - 5.11 MIL/uL   Hemoglobin 10.5 (L) 12.0 - 15.0 g/dL   HCT 24.432.2 (L) 01.036.0 - 27.246.0 %   MCV 84.5 78.0 - 100.0 fL   MCH 27.6 26.0 - 34.0 pg   MCHC 32.6 30.0 - 36.0 g/dL   RDW 53.613.6 64.411.5 - 03.415.5 %   Platelets 184 150 - 400 K/uL   A/POS/-- (06/18 1040)  Imaging:    MAU Course/MDM: I have ordered labs and reviewed results.  NST reviewed and reactive No elevated WBCs, Potassium wnl today, 15 ketones on UA D5LR x 1000 ml, Zofran 4 mg IV given in MAU [facility] primary care if diarrhea continues over the weekend Return to MAU if unable to keep down fluids x 24 hours, or other emergencies [facility] Pt discharged with strict precautions/reasons to  return   Assessment: 1. Viral gastroenteritis   2. Anxiety disorder affecting pregnancy, antepartum   3. Depression affecting pregnancy   4. Pregnancy, supervision of first, unspecified trimester   5. Diarrhea of presumed infectious origin   6. [redacted] weeks gestation  of pregnancy   7. Back pain affecting pregnancy in third trimester   8. Pain of round ligament affecting pregnancy, antepartum   9. Back pain during pregnancy     Plan: Discharge home Labor precautions and fetal kick counts Follow-up Information    [facility] [place] Follow up.   Specialty:  Obstetrics and Gynecology Why:  As scheduled, return to MAU as needed for emergencies Contact information: [address] [place] [address] [phone]         Allergies as of 08/29/2017   No Known Allergies     Medication List    TAKE these medications   acetaminophen 500 MG tablet Commonly known as:  TYLENOL Take 500 mg by mouth every 6 (six) hours as needed for mild pain or headache.   calcium carbonate 500 MG chewable tablet Commonly known as:  TUMS - dosed in mg elemental calcium Chew 1 tablet by mouth 2 (two) times daily as needed for indigestion or heartburn.   COMFORT FIT MATERNITY SUPP MED Misc 1 Device by Does not apply route daily.   multivitamin-prenatal 27-0.8 MG Tabs tablet Take 1 tablet by mouth daily at 12 noon.   ondansetron 4 MG tablet Commonly known as:  ZOFRAN Take 1-2 tablets (4-8 mg total) by mouth every 8 (eight) hours as needed for nausea or vomiting (Or diarrhea).   promethazine 12.5 MG tablet Commonly known as:  PHENERGAN Take 1 tablet (12.5 mg total) by mouth every 6 (six) hours as needed for nausea or vomiting.   sertraline 25 MG tablet Commonly known as:  ZOLOFT Take 1 tablet (25 mg total) by mouth daily.       Sharen Counter Certified Nurse-Midwife 08/29/2017 6:17 PM

## 2017-09-08 ENCOUNTER — Ambulatory Visit (INDEPENDENT_AMBULATORY_CARE_PROVIDER_SITE_OTHER): Payer: Medicaid Other | Admitting: Advanced Practice Midwife

## 2017-09-08 VITALS — BP 120/73 | HR 74 | Wt 134.0 lb

## 2017-09-08 DIAGNOSIS — Z23 Encounter for immunization: Secondary | ICD-10-CM

## 2017-09-08 DIAGNOSIS — Z3400 Encounter for supervision of normal first pregnancy, unspecified trimester: Secondary | ICD-10-CM

## 2017-09-08 DIAGNOSIS — Z3490 Encounter for supervision of normal pregnancy, unspecified, unspecified trimester: Secondary | ICD-10-CM

## 2017-09-08 DIAGNOSIS — Z3403 Encounter for supervision of normal first pregnancy, third trimester: Secondary | ICD-10-CM

## 2017-09-08 NOTE — Progress Notes (Signed)
   PRENATAL VISIT NOTE  Subjective:  Lauren Lawson is a 21 y.o. G1P0 at 5062w0d being seen today for ongoing prenatal care.  She is currently monitored for the following issues for this low-risk pregnancy and has Pregnancy, supervision of first, unspecified trimester; Anxiety disorder affecting pregnancy, antepartum; and Depression affecting pregnancy on her problem list.  Patient reports no complaints.  Contractions: Not present. Vag. Bleeding: None.  Movement: Present. Denies leaking of fluid.   The following portions of the patient's history were reviewed and updated as appropriate: allergies, current medications, past family history, past medical history, past social history, past surgical history and problem list. Problem list updated.  Objective:   Vitals:   09/08/17 0823  BP: 120/73  Pulse: 74  Weight: 134 lb (60.8 kg)    Fetal Status: Fetal Heart Rate (bpm): 133   Movement: Present     General:  Alert, oriented and cooperative. Patient is in no acute distress.  Skin: Skin is warm and dry. No rash noted.   Cardiovascular: Normal heart rate noted  Respiratory: Normal respiratory effort, no problems with respiration noted  Abdomen: Soft, gravid, appropriate for gestational age.  Pain/Pressure: Absent     Pelvic: Cervical exam deferred        Extremities: Normal range of motion.  Edema: None  Mental Status:  Normal mood and affect. Normal behavior. Normal judgment and thought content.   Assessment and Plan:  Pregnancy: G1P0 at 1562w0d  1. Encounter for supervision of normal pregnancy, antepartum, unspecified gravidity  - HIV antibody (with reflex) - CBC - RPR - 2Hr GTT w/ 1 Hr Carpenter 75 g - Tdap vaccine greater than or equal to 7yo IM  2. Pregnancy, supervision of first, unspecified trimester  3. Depression--Stable - Declines IBH appointment.  Preterm labor symptoms and general obstetric precautions including but not limited to vaginal bleeding, contractions, leaking  of fluid and fetal movement were reviewed in detail with the patient. Please refer to After Visit Summary for other counseling recommendations.  Return in about 2 weeks (around 09/22/2017) for ROB.   Dorathy KinsmanVirginia Versie Soave, CNM

## 2017-09-08 NOTE — Patient Instructions (Addendum)
AREA P[facility]ATRIC/FAMILY [place] [address], [address] [place], [place]  [address] Phone - [phone]   Fax - [phone]  [facility] [place] [address] [place], [place] [address] Phone - [phone]   Fax - [place] [address] [place], [place]  [address] Phone - [phone]   Fax - [phone]  [place]. [address] [place] [address] [place], [place]  [address] Phone - [phone]   Fax - [phone]  [place] [address] [place], [place]  [address] Phone - [phone]   Fax - [phone]  [facility] [address] [place], [place]  [address] Phone - [phone]   Fax - [place] [address] [place], [place]  [address] Phone - [phone]   Fax - [phone]  [place] [address] [place], [place] [address] [place], [place]  [address] Phone - [phone]   Fax - [place] [address] [place], [place]  [address] Phone - [phone]   Fax - [phone] [facility] [place]. [address] [place], [place]  [address] Phone - [phone]   Fax - [phone]  [place] [address] [place], [place]  [address] Phone - [phone]   Fax - [phone]  [facility] [place], [place], [place]  [address] Phone - [phone]   Fax - [place] [address] [place], [place]  [address] Phone - [phone]   Fax - [phone]  [facility] [address] [place], [place]  [address] Phone - [place], [place]  [address] Phone - [phone]   Fax - [place] [address] [place], [place]  [address] Phone - [phone]   Fax - [phone]  [place] [address] [place], [place]  [address] Phone - [phone]   Fax - [place]. [place], [place]  [address] Phone - [phone]   Fax - [place], [place], [place]  [address] Phone - [phone]   Fax - [place] [address], [place], [place]  [address] Phone - [phone]   Fax - [phone]  [place] [address] [place], [place]  [address] Phone - [phone]   Fax - [place] [address] [place], [place]  [address] Phone - [phone]   Fax - [phone]  [place] [address] [place], [place]  [address] Phone - [phone]   Fax - [phone] [place] [address] [place], [place]  [address] Phone - [phone]   Fax - [place] [address] [place], [place]  [address] Phone - [phone]   Fax - [place] [address] [place], [place]  [address] Phone - [phone]   Fax - [phone]  [place] [address] [place] [address] Phone [phone]  Fax [phone]  Places to have your son circumcised:    [facility] [phone] while you  are in hospital  [facility] [phone] $244 by 4 wks  [facility] [phone] $175 by 2 wks  [place] [phone] $250 by 7 days [facility] [phone] $150 by 4 wks  These prices sometimes change but are roughly what you can expect to pay. Please  call and confirm pricing.   Circumcision is considered an elective/non-medically necessary procedure. There are many reasons parents decide to have their sons circumsized. During the first year of life circumcised males have a reduced risk of urinary tract infections but after this year the rates between circumcised males and uncircumcised males are the same.  It is safe to have your son circumcised outside of the hospital and the places above perform them regularly.     Contraception Choices Contraception (birth control) is the use of any methods or devices to prevent pregnancy. Below are some methods to help avoid pregnancy. Hormonal methods  Contraceptive implant. This is a thin, plastic tube containing progesterone hormone. It does not contain estrogen hormone. Your health care provider inserts the tube in the inner part of the upper arm. The tube can remain in place for up to 3 years. After 3 years, the implant must be removed. The implant prevents the ovaries from releasing an egg (ovulation), thickens the cervical mucus to prevent sperm from entering the uterus, and thins the lining of the inside of the uterus.  Progesterone-only injections. These injections are given every 3 months by your health care provider to prevent pregnancy. This synthetic progesterone hormone stops the ovaries from releasing eggs. It also thickens cervical mucus and changes the uterine lining. This makes it harder for sperm to survive in the uterus.  Birth control pills. These pills contain estrogen and progesterone hormone. They work by preventing the ovaries from releasing eggs (ovulation). They also cause the cervical mucus to thicken, preventing the sperm from entering the uterus. Birth control pills are prescribed by a health care provider.Birth control pills can also be used to treat heavy periods.  Minipill. This type of birth control pill contains only the progesterone hormone. They are taken every day of  each month and must be prescribed by your health care provider.  Birth control patch. The patch contains hormones similar to those in birth control pills. It must be changed once a week and is prescribed by a health care provider.  Vaginal ring. The ring contains hormones similar to those in birth control pills. It is left in the vagina for 3 weeks, removed for 1 week, and then a new one is put back in place. The patient must be comfortable inserting and removing the ring from the vagina.A health care provider's prescription is necessary.  Emergency contraception. Emergency contraceptives prevent pregnancy after unprotected sexual intercourse. This pill can be taken right after sex or up to 5 days after unprotected sex. It is most effective the sooner you take the pills after having sexual intercourse. Most emergency contraceptive pills are available without a prescription. Check with your pharmacist. Do not use emergency contraception as your only form of birth control. Barrier methods  Female condom. This is a thin sheath (latex or rubber) that is worn over the penis during sexual intercourse. It can be used with spermicide to increase effectiveness.  Female condom. This is a soft, loose-fitting sheath that is put into the vagina before sexual intercourse.  Diaphragm. This is a soft, latex, dome-shaped barrier that must be fitted by a health care provider. It is inserted into the vagina, along with a spermicidal  jelly. It is inserted before intercourse. The diaphragm should be left in the vagina for 6 to 8 hours after intercourse.  Cervical cap. This is a round, soft, latex or plastic cup that fits over the cervix and must be fitted by a health care provider. The cap can be left in place for up to 48 hours after intercourse.  Sponge. This is a soft, circular piece of polyurethane foam. The sponge has spermicide in it. It is inserted into the vagina after wetting it and before sexual  intercourse.  Spermicides. These are chemicals that kill or block sperm from entering the cervix and uterus. They come in the form of creams, jellies, suppositories, foam, or tablets. They do not require a prescription. They are inserted into the vagina with an applicator before having sexual intercourse. The process must be repeated every time you have sexual intercourse. Intrauterine contraception  Intrauterine device (IUD). This is a T-shaped device that is put in a woman's uterus during a menstrual period to prevent pregnancy. There are 2 types: ? Copper IUD. This type of IUD is wrapped in copper wire and is placed inside the uterus. Copper makes the uterus and fallopian tubes produce a fluid that kills sperm. It can stay in place for 10 years. ? Hormone IUD. This type of IUD contains the hormone progestin (synthetic progesterone). The hormone thickens the cervical mucus and prevents sperm from entering the uterus, and it also thins the uterine lining to prevent implantation of a fertilized egg. The hormone can weaken or kill the sperm that get into the uterus. It can stay in place for 3-5 years, depending on which type of IUD is used. Permanent methods of contraception  Female tubal ligation. This is when the woman's fallopian tubes are surgically sealed, tied, or blocked to prevent the egg from traveling to the uterus.  Hysteroscopic sterilization. This involves placing a small coil or insert into each fallopian tube. Your doctor uses a technique called hysteroscopy to do the procedure. The device causes scar tissue to form. This results in permanent blockage of the fallopian tubes, so the sperm cannot fertilize the egg. It takes about 3 months after the procedure for the tubes to become blocked. You must use another form of birth control for these 3 months.  Female sterilization. This is when the female has the tubes that carry sperm tied off (vasectomy).This blocks sperm from entering the vagina  during sexual intercourse. After the procedure, the man can still ejaculate fluid (semen). Natural planning methods  Natural family planning. This is not having sexual intercourse or using a barrier method (condom, diaphragm, cervical cap) on days the woman could become pregnant.  Calendar method. This is keeping track of the length of each menstrual cycle and identifying when you are fertile.  Ovulation method. This is avoiding sexual intercourse during ovulation.  Symptothermal method. This is avoiding sexual intercourse during ovulation, using a thermometer and ovulation symptoms.  Post-ovulation method. This is timing sexual intercourse after you have ovulated. Regardless of which type or method of contraception you choose, it is important that you use condoms to protect against the transmission of sexually transmitted infections (STIs). Talk with your health care provider about which form of contraception is most appropriate for you. This information is not intended to replace advice given to you by your health care provider. Make sure you discuss any questions you have with your health care provider. Document Released: 10/28/2005 Document Revised: 04/04/2016 Document Reviewed: 04/22/2013 Elsevier Interactive Patient  Education  2017 Elsevier Inc.  

## 2017-09-09 LAB — CBC
HEMATOCRIT: 30.4 % — AB (ref 35.0–45.0)
Hemoglobin: 10.2 g/dL — ABNORMAL LOW (ref 11.7–15.5)
MCH: 27.1 pg (ref 27.0–33.0)
MCHC: 33.6 g/dL (ref 32.0–36.0)
MCV: 80.9 fL (ref 80.0–100.0)
MPV: 10.8 fL (ref 7.5–12.5)
PLATELETS: 198 10*3/uL (ref 140–400)
RBC: 3.76 10*6/uL — ABNORMAL LOW (ref 3.80–5.10)
RDW: 13.4 % (ref 11.0–15.0)
WBC: 6.4 10*3/uL (ref 3.8–10.8)

## 2017-09-09 LAB — 2HR GTT W 1 HR, CARPENTER, 75 G
GLUCOSE, 1 HR, GEST: 94 mg/dL (ref 65–179)
GLUCOSE, 2 HR, GEST: 80 mg/dL (ref 65–152)
GLUCOSE, FASTING, GEST: 72 mg/dL (ref 65–91)

## 2017-09-09 LAB — RPR: RPR Ser Ql: NONREACTIVE

## 2017-09-09 LAB — HIV ANTIBODY (ROUTINE TESTING W REFLEX): HIV 1&2 Ab, 4th Generation: NONREACTIVE

## 2017-09-11 ENCOUNTER — Telehealth: Payer: Self-pay

## 2017-09-11 NOTE — Telephone Encounter (Signed)
Left message on pt's voicemail letting her know she passed 2 hour GTT.

## 2017-09-22 ENCOUNTER — Ambulatory Visit (INDEPENDENT_AMBULATORY_CARE_PROVIDER_SITE_OTHER): Payer: Medicaid Other | Admitting: Advanced Practice Midwife

## 2017-09-22 VITALS — BP 105/72 | HR 92 | Wt 135.0 lb

## 2017-09-22 DIAGNOSIS — F32A Depression, unspecified: Secondary | ICD-10-CM

## 2017-09-22 DIAGNOSIS — O99340 Other mental disorders complicating pregnancy, unspecified trimester: Secondary | ICD-10-CM

## 2017-09-22 DIAGNOSIS — F329 Major depressive disorder, single episode, unspecified: Secondary | ICD-10-CM

## 2017-09-22 DIAGNOSIS — O99343 Other mental disorders complicating pregnancy, third trimester: Secondary | ICD-10-CM

## 2017-09-22 DIAGNOSIS — O219 Vomiting of pregnancy, unspecified: Secondary | ICD-10-CM

## 2017-09-22 DIAGNOSIS — Z3400 Encounter for supervision of normal first pregnancy, unspecified trimester: Secondary | ICD-10-CM

## 2017-09-22 MED ORDER — ONDANSETRON HCL 4 MG PO TABS: 4.0000 mg | ORAL_TABLET | Freq: Three times a day (TID) | ORAL | 1 refills | Status: DC | PRN

## 2017-09-22 NOTE — Progress Notes (Signed)
   PRENATAL VISIT NOTE  Subjective:  Lauren Lawson is a 21 y.o. G1P0 at 8236w0d being seen today for ongoing prenatal care.  She is currently monitored for the following issues for this low-risk pregnancy and has Pregnancy, supervision of first, unspecified trimester; Anxiety disorder affecting pregnancy, antepartum; and Depression affecting pregnancy on their problem list.  Patient reports nausea, vomiting, fatigue and SOB. No cough, chest pain, palpitations.  Contractions: Not present. Vag. Bleeding: None.  Movement: Present. Denies leaking of fluid.   The following portions of the patient's history were reviewed and updated as appropriate: allergies, current medications, past family history, past medical history, past social history, past surgical history and problem list. Problem list updated.  Objective:   Vitals:   09/22/17 1022  BP: 105/72  Pulse: 92  Weight: 135 lb (61.2 kg)    Fetal Status: Fetal Heart Rate (bpm): 140 Fundal Height: 30 cm Movement: Present     General:  Alert, oriented and cooperative. Patient is in no acute distress.  Skin: Skin is warm and dry. No rash noted.   Cardiovascular: Normal heart rate noted  Respiratory: Normal respiratory effort, no problems with respiration noted  Abdomen: Soft, gravid, appropriate for gestational age.  Pain/Pressure: Absent     Pelvic: Cervical exam deferred        Extremities: Normal range of motion.  Edema: None  Mental Status:  Normal mood and affect. Normal behavior. Normal judgment and thought content.   Assessment and Plan:  Pregnancy: G1P0 at 7936w0d  1. Pregnancy, supervision of first, unspecified trimester   2. Depression affecting pregnancy   3. Nausea and vomiting of pregnancy, antepartum  - ondansetron (ZOFRAN) 4 MG tablet; Take 1-2 tablets (4-8 mg total) every 8 (eight) hours as needed by mouth for nausea or vomiting (Or diarrhea).  Dispense: 20 tablet; Refill: 1  4. Fatigue and SOB likely 2/2 physiologic  anemia of pregnancy.   Preterm labor symptoms and general obstetric precautions including but not limited to vaginal bleeding, contractions, leaking of fluid and fetal movement were reviewed in detail with the patient. Please refer to After Visit Summary for other counseling recommendations.  F/U in 2 weeks   Dorathy KinsmanVirginia Stanislawa Gaffin, [place]

## 2017-09-22 NOTE — Patient Instructions (Signed)
Iron-Rich Diet Iron is a mineral that helps your body to produce hemoglobin. Hemoglobin is a protein in your red blood cells that carries oxygen to your body's tissues. Eating too little iron may cause you to feel weak and tired, and it can increase your risk for infection. Eating enough iron is necessary for your body's metabolism, muscle function, and nervous system. Iron is naturally found in many foods. It can also be added to foods or fortified in foods. There are two types of dietary iron:  Heme iron. Heme iron is absorbed by the body more easily than nonheme iron. Heme iron is found in meat, poultry, and fish.  Nonheme iron. Nonheme iron is found in dietary supplements, iron-fortified grains, beans, and vegetables.  You may need to follow an iron-rich diet if:  You have been diagnosed with iron deficiency or iron-deficiency anemia.  You have a condition that prevents you from absorbing dietary iron, such as: ? Infection in your intestines. ? Celiac disease. This involves long-lasting (chronic) inflammation of your intestines.  You do not eat enough iron.  You eat a diet that is high in foods that impair iron absorption.  You have lost a lot of blood.  You have heavy bleeding during your menstrual cycle.  You are pregnant.  What is my plan? Your health care provider may help you to determine how much iron you need per day based on your condition. Generally, when a person consumes sufficient amounts of iron in the diet, the following iron needs are met:  Men. ? 63-68 years old: 11 mg per day. ? 39-16 years old: 8 mg per day.  Women. ? 4-34 years old: 15 mg per day. ? 55-13 years old: 18 mg per day. ? Over 48 years old: 8 mg per day. ? Pregnant women: 27 mg per day. ? Breastfeeding women: 9 mg per day.  What do I need to know about an iron-rich diet?  Eat fresh fruits and vegetables that are high in vitamin C along with foods that are high in iron. This will help  increase the amount of iron that your body absorbs from food, especially with foods containing nonheme iron. Foods that are high in vitamin C include oranges, peppers, tomatoes, and mango.  Take iron supplements only as directed by your health care provider. Overdose of iron can be life-threatening. If you were prescribed iron supplements, take them with orange juice or a vitamin C supplement.  Cook foods in pots and pans that are made from iron.  Eat nonheme iron-containing foods alongside foods that are high in heme iron. This helps to improve your iron absorption.  Certain foods and drinks contain compounds that impair iron absorption. Avoid eating these foods in the same meal as iron-rich foods or with iron supplements. These include: ? Coffee, black tea, and red wine. ? Milk, dairy products, and foods that are high in calcium. ? Beans, soybeans, and peas. ? Whole grains.  When eating foods that contain both nonheme iron and compounds that impair iron absorption, follow these tips to absorb iron better. ? Soak beans overnight before cooking. ? Soak whole grains overnight and drain them before using. ? Ferment flours before baking, such as using yeast in bread dough. What foods can I eat? Grains Iron-fortified breakfast cereal. Iron-fortified whole-wheat bread. Enriched rice. Sprouted grains. Vegetables Spinach. Potatoes with skin. Green peas. Broccoli. Red and green bell peppers. Fermented vegetables. Fruits Prunes. Raisins. Oranges. Strawberries. Mango. Grapefruit. Meats and Other Protein Sources  Beef liver. Oysters. Beef. Shrimp. [place]. Chicken. [place]. Sardines. Chickpeas. Nuts. Tofu. Beverages Tomato juice. Fresh orange juice. Prune juice. Hibiscus tea. Fortified instant breakfast shakes. Condiments Tahini. Fermented soy sauce. Sweets and Desserts Black-strap molasses. Other Wheat germ. The items listed above may not be a complete list of recommended foods or beverages.  Contact your dietitian for more options. What foods are not recommended? Grains Whole grains. Bran cereal. Bran flour. Oats. Vegetables Artichokes. Brussels sprouts. Kale. Fruits Blueberries. Raspberries. Strawberries. Figs. Meats and Other Protein Sources Soybeans. Products made from soy protein. Dairy Milk. Cream. Cheese. Yogurt. Cottage cheese. Beverages Coffee. Black tea. Red wine. Sweets and Desserts Cocoa. Chocolate. Ice cream. Other Basil. Oregano. Parsley. The items listed above may not be a complete list of foods and beverages to avoid. Contact your dietitian for more information. This information is not intended to replace advice given to you by your health care provider. Make sure you discuss any questions you have with your health care provider. Document Released: 06/11/2005 Document Revised: 05/17/2016 Document Reviewed: 05/25/2014 Elsevier Interactive Patient Education  [employer].

## 2017-10-06 ENCOUNTER — Other Ambulatory Visit (HOSPITAL_COMMUNITY)
Admission: RE | Admit: 2017-10-06 | Discharge: 2017-10-06 | Disposition: A | Discharge status: Home / Self Care | Payer: Medicaid Other | Source: Ambulatory Visit | Attending: Advanced Practice Midwife | Admitting: Advanced Practice Midwife

## 2017-10-06 ENCOUNTER — Ambulatory Visit (INDEPENDENT_AMBULATORY_CARE_PROVIDER_SITE_OTHER): Payer: Medicaid Other | Admitting: Advanced Practice Midwife

## 2017-10-06 VITALS — BP 119/85 | HR 85 | Temp 97.1°F | Wt 137.0 lb

## 2017-10-06 DIAGNOSIS — Z3A33 33 weeks gestation of pregnancy: Secondary | ICD-10-CM | POA: Insufficient documentation

## 2017-10-06 DIAGNOSIS — N898 Other specified noninflammatory disorders of vagina: Secondary | ICD-10-CM

## 2017-10-06 DIAGNOSIS — L299 Pruritus, unspecified: Secondary | ICD-10-CM | POA: Diagnosis not present

## 2017-10-06 DIAGNOSIS — O26893 Other specified pregnancy related conditions, third trimester: Secondary | ICD-10-CM

## 2017-10-06 DIAGNOSIS — Z3400 Encounter for supervision of normal first pregnancy, unspecified trimester: Secondary | ICD-10-CM

## 2017-10-06 DIAGNOSIS — O26899 Other specified pregnancy related conditions, unspecified trimester: Principal | ICD-10-CM

## 2017-10-06 MED ORDER — TERCONAZOLE 0.4 % VA CREA: 1.0000 | TOPICAL_CREAM | Freq: Every day | VAGINAL | 0 refills | Status: DC

## 2017-10-06 NOTE — Progress Notes (Signed)
    PRENATAL VISIT NOTE  Subjective:  Lauren Lawson is a 21 y.o. G1P0 at [redacted]w[redacted]d being seen today for ongoing prenatal care.  She is currently monitored for the following issues for this low-risk pregnancy and has Pregnancy, supervision of first, unspecified trimester; Anxiety disorder affecting pregnancy, antepartum; and Depression affecting pregnancy on their problem list.  Patient reports generalized itching x 2-3 weeks, no rash, not worsening since onset; white vaginal discharge with itching x 2-3 days.  Contractions: Not present. Vag. Bleeding: None.  Movement: Present. Denies leaking of fluid.   The following portions of the patient's history were reviewed and updated as appropriate: allergies, current medications, past family history, past medical history, past social history, past surgical history and problem list. Problem list updated.  Objective:   Vitals:   10/06/17 1032  BP: 119/85  Pulse: 85  Temp: (!) 97.1 F (36.2 C)  Weight: 137 lb (62.1 kg)    Fetal Status: Fetal Heart Rate (bpm): 141 Fundal Height: 32 cm Movement: Present     General:  Alert, oriented and cooperative. Patient is in no acute distress.  Skin: Skin is warm and dry. No rash noted.   Cardiovascular: Normal heart rate noted  Respiratory: Normal respiratory effort, no problems with respiration noted  Abdomen: Soft, gravid, appropriate for gestational age.  Pain/Pressure: Absent     Pelvic: Cervical exam deferred        Extremities: Normal range of motion.  Edema: None  Mental Status:  Normal mood and affect. Normal behavior. Normal judgment and thought content.   Assessment and Plan:  Pregnancy: G1P0 at [redacted]w[redacted]d  1. Vaginal discharge during pregnancy, antepartum --Vaginal discharge with itching, discharge noted on exam c/w yeast. --Will treat presumptively for candida with Terazol 7, Rx sent to pharmacy. - Cervicovaginal ancillary only  2. Itching --Generalized itching x 2-3 weeks, not on palms/soles of  feet, same in intensity since onset.  No visible rash.   - Bile acids, total  3. Pregnancy, supervision of first, unspecified trimester   Preterm labor symptoms and general obstetric precautions including but not limited to vaginal bleeding, contractions, leaking of fluid and fetal movement were reviewed in detail with the patient. Please refer to After Visit Summary for other counseling recommendations.  Return in about 2 weeks (around 10/20/2017).   Sharen CounterLisa Leftwich-Kirby, CNM

## 2017-10-07 LAB — CERVICOVAGINAL ANCILLARY ONLY
Bacterial vaginitis: NEGATIVE
Candida vaginitis: POSITIVE — AB

## 2017-10-08 LAB — BILE ACIDS, TOTAL: Bile Acids Total: 9 umol/L (ref 0–19)

## 2017-10-20 ENCOUNTER — Encounter: Payer: Medicaid Other | Admitting: Advanced Practice Midwife

## 2017-10-22 ENCOUNTER — Ambulatory Visit (INDEPENDENT_AMBULATORY_CARE_PROVIDER_SITE_OTHER): Payer: Medicaid Other | Admitting: Obstetrics & Gynecology

## 2017-10-22 ENCOUNTER — Other Ambulatory Visit (HOSPITAL_COMMUNITY)
Admission: RE | Admit: 2017-10-22 | Discharge: 2017-10-22 | Disposition: A | Discharge status: Home / Self Care | Payer: Medicaid Other | Source: Ambulatory Visit | Attending: Obstetrics & Gynecology | Admitting: Obstetrics & Gynecology

## 2017-10-22 VITALS — BP 124/80 | HR 70

## 2017-10-22 DIAGNOSIS — F32A Depression, unspecified: Secondary | ICD-10-CM

## 2017-10-22 DIAGNOSIS — F329 Major depressive disorder, single episode, unspecified: Secondary | ICD-10-CM

## 2017-10-22 DIAGNOSIS — Z3483 Encounter for supervision of other normal pregnancy, third trimester: Secondary | ICD-10-CM | POA: Insufficient documentation

## 2017-10-22 DIAGNOSIS — Z3400 Encounter for supervision of normal first pregnancy, unspecified trimester: Secondary | ICD-10-CM

## 2017-10-22 DIAGNOSIS — O99340 Other mental disorders complicating pregnancy, unspecified trimester: Secondary | ICD-10-CM

## 2017-10-22 LAB — OB RESULTS CONSOLE GBS: GBS: POSITIVE

## 2017-10-22 LAB — OB RESULTS CONSOLE GC/CHLAMYDIA: GC PROBE AMP, GENITAL: NEGATIVE

## 2017-10-22 MED ORDER — TRIAMCINOLONE 0.1 % CREAM:EUCERIN CREAM 1:1: TOPICAL_CREAM | Freq: Two times a day (BID) | CUTANEOUS | Status: AC

## 2017-10-22 MED ORDER — OMEPRAZOLE 20 MG PO CPDR: 20.0000 mg | DELAYED_RELEASE_CAPSULE | Freq: Every day | ORAL | 1 refills | Status: DC

## 2017-10-22 NOTE — Progress Notes (Signed)
   PRENATAL VISIT NOTE  Subjective:  Lauren Lawson is a 21 y.o. G1P0 at 6825w1d being seen today for ongoing prenatal care.  She is currently monitored for the following issues for this low-risk pregnancy and has Pregnancy, supervision of first, unspecified trimester; Anxiety disorder affecting pregnancy, antepartum; and Depression affecting pregnancy on their problem list.  Patient reports heart burn and itching on thighs (but better than last visit).  Contractions: Not present. Vag. Bleeding: None.  Movement: Present. Denies leaking of fluid.   The following portions of the patient's history were reviewed and updated as appropriate: allergies, current medications, past family history, past medical history, past social history, past surgical history and problem list. Problem list updated.  Objective:   Vitals:   10/22/17 1121  BP: 124/80  Pulse: 70    Fetal Status: Fetal Heart Rate (bpm): 146   Movement: Present     General:  Alert, oriented and cooperative. Patient is in no acute distress.  Skin: Skin is warm and dry. No rash noted.   Cardiovascular: Normal heart rate noted  Respiratory: Normal respiratory effort, no problems with respiration noted  Abdomen: Soft, gravid, appropriate for gestational age.  Pain/Pressure: Absent     Pelvic: Cervical exam performed      vertex, posterior, closed (difficult to reach)  Extremities: Normal range of motion.  Edema: None  Mental Status:  Normal mood and affect. Normal behavior. Normal judgment and thought content.   Assessment and Plan:  Pregnancy: G1P0 at 4525w1d  1. Encounter for supervision of other normal pregnancy in third trimester - Culture, beta strep (group b only) - [facility]lam[facility]  2. Eczema--Pt has itching on thighs, not palms and soles.  It is better than before.  Pt declines blood draw for rpt bile acids today.  Pt agrees to eucerin/triacenalone cream.  If not better, pt to call Friday and rpt  bile acids  3. Heartburn Pt reports increase heartburn that resolved when she took a Prilosec of a friend.  She hs had heartburn most of pregnancy but much worse now.    Term labor symptoms and general obstetric precautions including but not limited to vaginal bleeding, contractions, leaking of fluid and fetal movement were reviewed in detail with the patient. Please refer to After Visit Summary for other counseling recommendations.  Return in about 1 week (around 10/29/2017).   Elsie LincolnKelly Lorri Fukuhara, MD

## 2017-10-23 LAB — GC/CHLAMYDIA PROBE AMP (~~LOC~~) NOT AT ARMC
CHLAMYDIA, DNA PROBE: NEGATIVE
NEISSERIA GONORRHEA: NEGATIVE

## 2017-10-24 LAB — CULTURE, BETA STREP (GROUP B ONLY)
MICRO NUMBER: 81396717
SPECIMEN QUALITY:: ADEQUATE

## 2017-10-29 ENCOUNTER — Encounter: Payer: Medicaid Other | Admitting: Obstetrics & Gynecology

## 2017-10-30 ENCOUNTER — Ambulatory Visit (INDEPENDENT_AMBULATORY_CARE_PROVIDER_SITE_OTHER): Payer: Medicaid Other | Admitting: Obstetrics & Gynecology

## 2017-10-30 VITALS — BP 129/79 | HR 79 | Wt 144.0 lb

## 2017-10-30 DIAGNOSIS — Z3400 Encounter for supervision of normal first pregnancy, unspecified trimester: Secondary | ICD-10-CM

## 2017-10-30 DIAGNOSIS — Z3403 Encounter for supervision of normal first pregnancy, third trimester: Secondary | ICD-10-CM

## 2017-10-30 NOTE — Progress Notes (Signed)
   PRENATAL VISIT NOTE  Subjective:  Lauren Lawson is a 21 y.o. G1P0 at 7370w2d being seen today for ongoing prenatal care.  She is currently monitored for the following issues for this low-risk pregnancy and has Pregnancy, supervision of first, unspecified trimester; Anxiety disorder affecting pregnancy, antepartum; and Depression affecting pregnancy on their problem list.  Patient reports Braxton Hick's contractions.  Contractions: Irritability. Vag. Bleeding: None.  Movement: Present. Denies leaking of fluid.   The following portions of the patient's history were reviewed and updated as appropriate: allergies, current medications, past family history, past medical history, past social history, past surgical history and problem list. Problem list updated.  Objective:   Vitals:   10/30/17 1541  BP: 129/79  Pulse: 79  Weight: 144 lb (65.3 kg)    Fetal Status: Fetal Heart Rate (bpm): 130   Movement: Present     General:  Alert, oriented and cooperative. Patient is in no acute distress.  Skin: Skin is warm and dry. No rash noted.   Cardiovascular: Normal heart rate noted  Respiratory: Normal respiratory effort, no problems with respiration noted  Abdomen: Soft, gravid, appropriate for gestational age.  Pain/Pressure: Absent     Pelvic: Cervical exam performed        Extremities: Normal range of motion.  Edema: None  Mental Status:  Normal mood and affect. Normal behavior. Normal judgment and thought content.   Assessment and Plan:  Pregnancy: G1P0 at 370w2d  1. Pregnancy, supervision of first, unspecified trimester   Preterm labor symptoms and general obstetric precautions including but not limited to vaginal bleeding, contractions, leaking of fluid and fetal movement were reviewed in detail with the patient. Please refer to After Visit Summary for other counseling recommendations.  No Follow-up on file.   Allie BossierMyra C Tarron Krolak, MD

## 2017-11-07 ENCOUNTER — Ambulatory Visit (INDEPENDENT_AMBULATORY_CARE_PROVIDER_SITE_OTHER): Payer: Medicaid Other | Admitting: Certified Nurse Midwife

## 2017-11-07 DIAGNOSIS — Z3400 Encounter for supervision of normal first pregnancy, unspecified trimester: Secondary | ICD-10-CM

## 2017-11-07 NOTE — Patient Instructions (Signed)
Braxton Hicks Contractions °Contractions of the uterus can occur throughout pregnancy, but they are not always a sign that you are in labor. You may have practice contractions called Braxton Hicks contractions. These false labor contractions are sometimes confused with true labor. °What are Braxton Hicks contractions? °Braxton Hicks contractions are tightening movements that occur in the muscles of the uterus before labor. Unlike true labor contractions, these contractions do not result in opening (dilation) and thinning of the cervix. Toward the end of pregnancy (32-34 weeks), Braxton Hicks contractions can happen more often and may become stronger. These contractions are sometimes difficult to tell apart from true labor because they can be very uncomfortable. You should not feel embarrassed if you go to the hospital with false labor. °Sometimes, the only way to tell if you are in true labor is for your health care provider to look for changes in the cervix. The health care provider will do a physical exam and may monitor your contractions. If you are not in true labor, the exam should show that your cervix is not dilating and your water has not broken. °If there are other health problems associated with your pregnancy, it is completely safe for you to be sent home with false labor. You may continue to have Braxton Hicks contractions until you go into true labor. °How to tell the difference between true labor and false labor °True labor °· Contractions last 30-70 seconds. °· Contractions become very regular. °· Discomfort is usually felt in the top of the uterus, and it spreads to the lower abdomen and low back. °· Contractions do not go away with walking. °· Contractions usually become more intense and increase in frequency. °· The cervix dilates and gets thinner. °False labor °· Contractions are usually shorter and not as strong as true labor contractions. °· Contractions are usually irregular. °· Contractions  are often felt in the front of the lower abdomen and in the groin. °· Contractions may go away when you walk around or change positions while lying down. °· Contractions get weaker and are shorter-lasting as time goes on. °· The cervix usually does not dilate or become thin. °Follow these instructions at home: °· Take over-the-counter and prescription medicines only as told by your health care provider. °· Keep up with your usual exercises and follow other instructions from your health care provider. °· Eat and drink lightly if you think you are going into labor. °· If Braxton Hicks contractions are making you uncomfortable: °? Change your position from lying down or resting to walking, or change from walking to resting. °? Sit and rest in a tub of warm water. °? Drink enough fluid to keep your urine pale yellow. Dehydration may cause these contractions. °? Do slow and deep breathing several times an hour. °· Keep all follow-up prenatal visits as told by your health care provider. This is important. °Contact a health care provider if: °· You have a fever. °· You have continuous pain in your abdomen. °Get help right away if: °· Your contractions become stronger, more regular, and closer together. °· You have fluid leaking or gushing from your vagina. °· You pass blood-tinged mucus (bloody show). °· You have bleeding from your vagina. °· You have low back pain that you never had before. °· You feel your baby’s head pushing down and causing pelvic pressure. °· Your baby is not moving inside you as much as it used to. °Summary °· Contractions that occur before labor are called Braxton   Hicks contractions, false labor, or practice contractions. °· Braxton Hicks contractions are usually shorter, weaker, farther apart, and less regular than true labor contractions. True labor contractions usually become progressively stronger and regular and they become more frequent. °· Manage discomfort from Braxton Hicks contractions by  changing position, resting in a warm bath, drinking plenty of water, or practicing deep breathing. °This information is not intended to replace advice given to you by your health care provider. Make sure you discuss any questions you have with your health care provider. °Document Released: 03/13/2017 Document Revised: 03/13/2017 Document Reviewed: 03/13/2017 °Elsevier Interactive Patient Education © 2018 Elsevier Inc. ° °

## 2017-11-07 NOTE — Progress Notes (Signed)
Subjective:  Jackie PlumCarley Burbridge is a 21 y.o. G1P0 at 6636w3d being seen today for ongoing prenatal care.  She is currently monitored for the following issues for this low-risk pregnancy and has Pregnancy, supervision of first, unspecified trimester; Anxiety disorder affecting pregnancy, antepartum; and Depression affecting pregnancy on their problem list.  Patient reports no complaints.  Contractions: Irritability. Vag. Bleeding: None.  Movement: Present. Denies leaking of fluid.   The following portions of the patient's history were reviewed and updated as appropriate: allergies, current medications, past family history, past medical history, past social history, past surgical history and problem list. Problem list updated.  Objective:   Vitals:   11/07/17 1020  BP: 121/75  Pulse: 71  Weight: 145 lb (65.8 kg)    Fetal Status: Fetal Heart Rate (bpm): 142 Fundal Height: 36 cm Movement: Present  Presentation: Vertex  General:  Alert, oriented and cooperative. Patient is in no acute distress.  Skin: Skin is warm and dry. No rash noted.   Cardiovascular: Normal heart rate noted  Respiratory: Normal respiratory effort, no problems with respiration noted  Abdomen: Soft, gravid, appropriate for gestational age. Pain/Pressure: Absent     Pelvic: Vag. Bleeding: None Vag D/C Character: Thin   Cervical exam performed Dilation: 1 Effacement (%): 60 Station: -2  Extremities: Normal range of motion.  Edema: None  Mental Status: Normal mood and affect. Normal behavior. Normal judgment and thought content.   Urinalysis:      Assessment and Plan:  Pregnancy: G1P0 at 6336w3d  1. Pregnancy, supervision of first, unspecified trimester  Term labor symptoms and general obstetric precautions including but not limited to vaginal bleeding, contractions, leaking of fluid and fetal movement were reviewed in detail with the patient. Please refer to After Visit Summary for other counseling recommendations.  Return in  about 1 week (around 11/14/2017).   Donette LarryBhambri, Meghanne Pletz, CNM

## 2017-11-11 NOTE — L&D Delivery Note (Signed)
Delivery Note Pushed effectively to delivery.  At 3:16 AM a viable and healthy female was delivered via Vaginal, Spontaneous (Presentation:OA ).  APGAR: 8, 9; weight  .   Placenta status: Spontaneous and grossly intact with 3 vessel Cord:  with the following complications: none  Anesthesia:  epidural Episiotomy: None Lacerations: Labial Suture Repair: 4-0 monocryl Est. Blood Loss (mL): 150  Mom to postpartum.  Baby to Couplet care / Skin to Skin.  Lauren Lawson 11/14/2017, 3:50 AM  Please schedule this patient for Postpartum visit in: 4 weeks with the following provider: Any provider For C/S patients schedule nurse incision check in weeks 2 weeks: no Low risk pregnancy complicated by: none Delivery mode:  SVD Anticipated Birth Control:  other/unsure PP Procedures needed: none  Schedule Integrated BH visit: yes hx bipolar

## 2017-11-13 ENCOUNTER — Other Ambulatory Visit: Payer: Self-pay

## 2017-11-13 ENCOUNTER — Inpatient Hospital Stay (HOSPITAL_COMMUNITY): Payer: Medicaid Other | Admitting: Anesthesiology

## 2017-11-13 ENCOUNTER — Inpatient Hospital Stay (HOSPITAL_COMMUNITY)
Admission: AD | Admit: 2017-11-13 | Discharge: 2017-11-15 | DRG: 807 | Disposition: A | Discharge status: Home / Self Care | Payer: Medicaid Other | Source: Ambulatory Visit | Attending: Obstetrics & Gynecology | Admitting: Obstetrics & Gynecology

## 2017-11-13 ENCOUNTER — Encounter (HOSPITAL_COMMUNITY): Payer: Self-pay | Admitting: *Deleted

## 2017-11-13 DIAGNOSIS — Z3A39 39 weeks gestation of pregnancy: Secondary | ICD-10-CM

## 2017-11-13 DIAGNOSIS — Z87891 Personal history of nicotine dependence: Secondary | ICD-10-CM | POA: Diagnosis not present

## 2017-11-13 DIAGNOSIS — Z3400 Encounter for supervision of normal first pregnancy, unspecified trimester: Secondary | ICD-10-CM

## 2017-11-13 DIAGNOSIS — O99344 Other mental disorders complicating childbirth: Secondary | ICD-10-CM | POA: Diagnosis present

## 2017-11-13 DIAGNOSIS — F419 Anxiety disorder, unspecified: Secondary | ICD-10-CM | POA: Diagnosis present

## 2017-11-13 DIAGNOSIS — O99824 Streptococcus B carrier state complicating childbirth: Secondary | ICD-10-CM | POA: Diagnosis present

## 2017-11-13 DIAGNOSIS — Z3483 Encounter for supervision of other normal pregnancy, third trimester: Secondary | ICD-10-CM | POA: Diagnosis present

## 2017-11-13 DIAGNOSIS — O99340 Other mental disorders complicating pregnancy, unspecified trimester: Secondary | ICD-10-CM

## 2017-11-13 DIAGNOSIS — F329 Major depressive disorder, single episode, unspecified: Secondary | ICD-10-CM | POA: Diagnosis present

## 2017-11-13 LAB — RAPID URINE DRUG SCREEN, HOSP PERFORMED
Amphetamines: NOT DETECTED
BENZODIAZEPINES: NOT DETECTED
Barbiturates: NOT DETECTED
Cocaine: NOT DETECTED
Opiates: NOT DETECTED
Tetrahydrocannabinol: NOT DETECTED

## 2017-11-13 LAB — CBC
HCT: 32.8 % — ABNORMAL LOW (ref 36.0–46.0)
Hemoglobin: 10.3 g/dL — ABNORMAL LOW (ref 12.0–15.0)
MCH: 23.6 pg — ABNORMAL LOW (ref 26.0–34.0)
MCHC: 31.4 g/dL (ref 30.0–36.0)
MCV: 75.1 fL — ABNORMAL LOW (ref 78.0–100.0)
PLATELETS: 286 10*3/uL (ref 150–400)
RBC: 4.37 MIL/uL (ref 3.87–5.11)
RDW: 15.7 % — AB (ref 11.5–15.5)
WBC: 14.2 10*3/uL — AB (ref 4.0–10.5)

## 2017-11-13 LAB — TYPE AND SCREEN
ABO/RH(D): A POS
Antibody Screen: NEGATIVE

## 2017-11-13 MED ORDER — PENICILLIN G POTASSIUM 5000000 UNITS IJ SOLR
5.0000 10*6.[IU] | Freq: Once | INTRAVENOUS | Status: AC
  Administered 2017-11-13: 5 10*6.[IU] via INTRAVENOUS
  Filled 2017-11-13: qty 5

## 2017-11-13 MED ORDER — OXYCODONE-ACETAMINOPHEN 5-325 MG PO TABS: 1.0000 | ORAL_TABLET | ORAL | Status: DC | PRN

## 2017-11-13 MED ORDER — ONDANSETRON HCL 4 MG/2ML IJ SOLN: 4.0000 mg | Freq: Four times a day (QID) | INTRAMUSCULAR | Status: DC | PRN

## 2017-11-13 MED ORDER — LIDOCAINE HCL (PF) 1 % IJ SOLN
30.0000 mL | INTRAMUSCULAR | Status: DC | PRN
  Filled 2017-11-13: qty 30

## 2017-11-13 MED ORDER — PHENYLEPHRINE 40 MCG/ML (10ML) SYRINGE FOR IV PUSH (FOR BLOOD PRESSURE SUPPORT)
80.0000 ug | PREFILLED_SYRINGE | INTRAVENOUS | Status: DC | PRN
  Filled 2017-11-13: qty 5

## 2017-11-13 MED ORDER — EPHEDRINE 5 MG/ML INJ
10.0000 mg | INTRAVENOUS | Status: DC | PRN
  Filled 2017-11-13: qty 2

## 2017-11-13 MED ORDER — LACTATED RINGERS IV SOLN
500.0000 mL | INTRAVENOUS | Status: DC | PRN
Start: 2017-11-13 — End: 2017-11-14

## 2017-11-13 MED ORDER — OXYTOCIN BOLUS FROM INFUSION
500.0000 mL | Freq: Once | INTRAVENOUS | Status: AC
  Administered 2017-11-14: 500 mL via INTRAVENOUS

## 2017-11-13 MED ORDER — LACTATED RINGERS IV SOLN
500.0000 mL | Freq: Once | INTRAVENOUS | Status: AC
  Administered 2017-11-13: 500 mL via INTRAVENOUS

## 2017-11-13 MED ORDER — SOD CITRATE-CITRIC ACID 500-334 MG/5ML PO SOLN: 30.0000 mL | ORAL | Status: DC | PRN

## 2017-11-13 MED ORDER — ACETAMINOPHEN 325 MG PO TABS
650.0000 mg | ORAL_TABLET | ORAL | Status: DC | PRN
  Filled 2017-11-13: qty 2

## 2017-11-13 MED ORDER — PENICILLIN G POT IN DEXTROSE 60000 UNIT/ML IV SOLN
3.0000 10*6.[IU] | INTRAVENOUS | Status: DC
  Administered 2017-11-14: 3 10*6.[IU] via INTRAVENOUS
  Filled 2017-11-13 (×4): qty 50

## 2017-11-13 MED ORDER — PHENYLEPHRINE 40 MCG/ML (10ML) SYRINGE FOR IV PUSH (FOR BLOOD PRESSURE SUPPORT)
80.0000 ug | PREFILLED_SYRINGE | INTRAVENOUS | Status: DC | PRN
  Filled 2017-11-13: qty 10
  Filled 2017-11-13: qty 5

## 2017-11-13 MED ORDER — LACTATED RINGERS IV SOLN
INTRAVENOUS | Status: DC
  Administered 2017-11-14: 01:00:00 via INTRAVENOUS

## 2017-11-13 MED ORDER — LIDOCAINE HCL (PF) 1 % IJ SOLN
INTRAMUSCULAR | Status: DC | PRN
  Administered 2017-11-13: 3 mL via EPIDURAL
  Administered 2017-11-13: 2 mL via EPIDURAL
  Administered 2017-11-13: 5 mL via EPIDURAL

## 2017-11-13 MED ORDER — FENTANYL 2.5 MCG/ML BUPIVACAINE 1/10 % EPIDURAL INFUSION (WH - ANES)
14.0000 mL/h | INTRAMUSCULAR | Status: DC | PRN
  Administered 2017-11-13 – 2017-11-14 (×2): 14 mL/h via EPIDURAL
  Filled 2017-11-13 (×2): qty 100

## 2017-11-13 MED ORDER — OXYCODONE-ACETAMINOPHEN 5-325 MG PO TABS: 2.0000 | ORAL_TABLET | ORAL | Status: DC | PRN

## 2017-11-13 MED ORDER — FENTANYL CITRATE (PF) 100 MCG/2ML IJ SOLN: 100.0000 ug | INTRAMUSCULAR | Status: DC | PRN

## 2017-11-13 MED ORDER — LACTATED RINGERS IV SOLN: 500.0000 mL | Freq: Once | INTRAVENOUS | Status: DC

## 2017-11-13 MED ORDER — DIPHENHYDRAMINE HCL 50 MG/ML IJ SOLN: 12.5000 mg | INTRAMUSCULAR | Status: DC | PRN

## 2017-11-13 MED ORDER — OXYTOCIN 40 UNITS IN LACTATED RINGERS INFUSION - SIMPLE MED
2.5000 [IU]/h | INTRAVENOUS | Status: DC
  Filled 2017-11-13: qty 1000

## 2017-11-13 NOTE — MAU Note (Signed)
Pt presents with c/o ctxs that began @ 2300 last night and increased in intensity & frequency @ 0400.  Denies LOF, reports bloody show.  Reports +FM.

## 2017-11-13 NOTE — H&P (Signed)
LABOR AND DELIVERY ADMISSION HISTORY AND PHYSICAL NOTE  Lauren Lawson is a 22 y.o. female G1P0 with IUP at [redacted]w[redacted]d by LMP presenting for SOL. Reports contractions since last night, stronger and more frequent since around 1600 today, ans having some vaginal bleeding. She reports good fetal movement. She denies leakage of fluid.  Prenatal History/Complications: PNC at [facility] Pregnancy complications:  - Anxiety and depression, bipolar disorder  Past Medical History: Past Medical History:  Diagnosis Date  . Anxiety   . Manic bipolar I disorder (HCC)     Past Surgical History: History reviewed. No pertinent surgical history.  Obstetrical History: OB History    Gravida Para Term Preterm AB Living   1             SAB TAB Ectopic Multiple Live Births                  Social History: Social History   Socioeconomic History  . Marital status: Single    Spouse name: None  . Number of children: None  . Years of education: None  . Highest education level: None  Social Needs  . Financial resource strain: None  . Food insecurity - worry: None  . Food insecurity - inability: None  . Transportation needs - medical: None  . Transportation needs - non-medical: None  Occupational History  . None  Tobacco Use  . Smoking status: Former Games developer  . Smokeless tobacco: Former Engineer, water and Sexual Activity  . Alcohol use: No  . Drug use: No  . Sexual activity: Yes    Birth control/protection: None  Other Topics Concern  . None  Social History Narrative  . None    Family History: Family History  Problem Relation Age of Onset  . Hypertension Father     Allergies: No Known Allergies  Facility-Administered Medications Prior to Admission  Medication Dose Route Frequency Provider Last Rate Last Dose  . triamcinolone 0.1 % cream : eucerin cream, 1:1   Topical BID Lesly Dukes, MD       Medications Prior to Admission  Medication Sig Dispense Refill Last Dose  .  acetaminophen (TYLENOL) 500 MG tablet Take 500 mg by mouth every 6 (six) hours as needed for mild pain or headache.   Not Taking  . calcium carbonate (TUMS - DOSED IN MG ELEMENTAL CALCIUM) 500 MG chewable tablet Chew 1 tablet by mouth 2 (two) times daily as needed for indigestion or heartburn.   Not Taking  . Elastic Bandages & Supports (COMFORT FIT MATERNITY SUPP MED) MISC 1 Device by Does not apply route daily. (Patient not taking: Reported on 10/06/2017) 1 each 0 Not Taking  . omeprazole (PRILOSEC) 20 MG capsule Take 1 capsule (20 mg total) by mouth daily. (Patient not taking: Reported on 11/07/2017) 30 capsule 1 Not Taking  . ondansetron (ZOFRAN) 4 MG tablet Take 1-2 tablets (4-8 mg total) every 8 (eight) hours as needed by mouth for nausea or vomiting (Or diarrhea). (Patient not taking: Reported on 10/06/2017) 20 tablet 1 Not Taking  . Prenatal Vit-Fe Fumarate-FA (MULTIVITAMIN-PRENATAL) 27-0.8 MG TABS tablet Take 1 tablet by mouth daily at 12 noon.   Taking  . sertraline (ZOLOFT) 25 MG tablet Take 1 tablet (25 mg total) by mouth daily. (Patient not taking: Reported on 10/06/2017) 60 tablet 1 Not Taking  . terconazole (TERAZOL 7) 0.4 % vaginal cream Place 1 applicator vaginally at bedtime. (Patient not taking: Reported on 10/22/2017) 45 g 0 Not Taking  Review of Systems  All systems reviewed and negative except as stated in HPI  Physical Exam Blood pressure 131/78, pulse 100, temperature 97.8 F (36.6 C), temperature source Oral, resp. rate 20, height 5\' 7"  (1.702 m), weight 146 lb (66.2 kg), last menstrual period 02/11/2017, SpO2 97 %. General appearance: alert, oriented; moving around uncomfortable with contractoins Lungs: normal respiratory effort Heart: regular rate Abdomen: soft, non-tender; gravid, FH appropriate for GA Extremities: No calf swelling or tenderness Presentation: cephalic by SVE Fetal monitoring: baseline rate 135, mod variability, +acel, no decel Uterine activity:  ctx q 3-4 min Dilation: 4.5 Effacement (%): 100 Station: -1 Exam by:: Dorrene GermanJ. Lowe RN  Prenatal labs: ABO, Rh: A/POS/-- (06/18 1040) Antibody: NEG (06/18 1040) Rubella: 3.84 (06/18 1040) RPR: NON-REACTIVE (10/29 0918)  HBsAg: NEGATIVE (06/18 1040)  HIV: NON-REACTIVE (10/29 0918)  GC/Chlamydia: negative GBS: Positive (12/12 0000)  2-hr GTT: normal Genetic screening:  Neg Quad screen Anatomy US: wnl  Prenatal Transfer Tool  Maternal Diabetes: No Genetic Screening: Normal Maternal Ultrasounds/Referrals: Normal Fetal Ultrasounds or other Referrals:  None Maternal Substance Abuse:  No Significant Maternal Medications:  Zoloft Significant Maternal Lab Results: Lab values include: Group B Strep positive  Assessment: Lauren Lawson is a 22 y.o. G1P0 at 6369w2d here for SOL.  #Labor: Expectant management  #Pain: Would like epidural #FWB: Cat I #ID:  GBS pos, start IV PCN #MOF: breast #Circ:  Yes, need to discuss cost as FOB interested in inpatient circ  Kandra NicolasJulie P Degele 11/13/2017, 6:45 PM

## 2017-11-13 NOTE — Anesthesia Preprocedure Evaluation (Addendum)
Anesthesia Evaluation  Patient identified by MRN, date of birth, ID band Patient awake    Reviewed: Allergy & Precautions, NPO status , Patient's Chart, lab work & pertinent test results  Airway Mallampati: II  TM Distance: >3 FB Neck ROM: Full    Dental  (+) Teeth Intact, Dental Advisory Given   Pulmonary former smoker,    Pulmonary exam normal breath sounds clear to auscultation       Cardiovascular negative cardio ROS Normal cardiovascular exam Rhythm:Regular Rate:Normal     Neuro/Psych PSYCHIATRIC DISORDERS Anxiety Depression Bipolar Disorder negative neurological ROS     GI/Hepatic negative GI ROS, Neg liver ROS,   Endo/Other  negative endocrine ROS  Renal/GU negative Renal ROS     Musculoskeletal negative musculoskeletal ROS (+)   Abdominal   Peds  Hematology  (+) Blood dyscrasia, anemia , Plt 286k   Anesthesia Other Findings Day of surgery medications reviewed with the patient.  Reproductive/Obstetrics (+) Pregnancy                             Anesthesia Physical Anesthesia Plan  ASA: II  Anesthesia Plan: Epidural   Post-op Pain Management:    Induction:   PONV Risk Score and Plan: 2 and Treatment may vary due to age or medical condition  Airway Management Planned:   Additional Equipment:   Intra-op Plan:   Post-operative Plan:   Informed Consent: I have reviewed the patients History and Physical, chart, labs and discussed the procedure including the risks, benefits and alternatives for the proposed anesthesia with the patient or authorized representative who has indicated his/her understanding and acceptance.   Dental advisory given  Plan Discussed with:   Anesthesia Plan Comments: (Patient identified. Risks/Benefits/Options discussed with patient including but not limited to bleeding, infection, nerve damage, paralysis, failed block, incomplete pain control,  headache, blood pressure changes, nausea, vomiting, reactions to medication both or allergic, itching and postpartum back pain. Confirmed with bedside nurse the patient's most recent platelet count. Confirmed with patient that they are not currently taking any anticoagulation, have any bleeding history or any family history of bleeding disorders. Patient expressed understanding and wished to proceed. All questions were answered. )       Anesthesia Quick Evaluation

## 2017-11-13 NOTE — Anesthesia Pain Management Evaluation Note (Signed)
  [facility] Pain Management Visit Note  Patient: Lauren PlumCarley Gaynor, 22 y.o., female  "Hello I am a member of the anesthesia team at [facility]. We have an anesthesia team available at all times to provide care throughout the hospital, including epidural management and anesthesia for C-section. I don't know your plan for the delivery whether it a natural birth, water birth, IV sedation, nitrous supplementation, doula or epidural, but we want to meet your pain goals."   1.Was your pain managed to your expectations on prior hospitalizations?   Yes   2.What is your expectation for pain management during this hospitalization?     Epidural  3.How can we help you reach that goal? epidural  Record the patient's initial score and the patient's pain goal.   Pain: 10/10  Pain Goal: 2/10 [facility] wants you to be able to say your pain was always managed very well.  Salome ArntSterling, Shatoya Roets Marie 11/13/2017

## 2017-11-13 NOTE — Progress Notes (Signed)
Pt informed she be up moving around in room if she wants or out walking.  Pt very anxious, will decide what she wants to do.

## 2017-11-13 NOTE — MAU Note (Signed)
Urine in lab 

## 2017-11-13 NOTE — Anesthesia Procedure Notes (Signed)
Epidural Patient location during procedure: OB Start time: 11/13/2017 7:52 PM End time: 11/13/2017 7:57 PM  Staffing Anesthesiologist: Cecile Hearingurk, Verlinda Slotnick Edward, MD Performed: anesthesiologist   Preanesthetic Checklist Completed: patient identified, pre-op evaluation, timeout performed, IV checked, risks and benefits discussed and monitors and equipment checked  Epidural Patient position: sitting Prep: DuraPrep Patient monitoring: blood pressure and continuous pulse ox Approach: midline Location: L3-L4 Injection technique: LOR air  Needle:  Needle type: Tuohy  Needle gauge: 17 G Needle length: 9 cm Needle insertion depth: 4 cm Catheter size: 19 Gauge Catheter at skin depth: 9 cm Test dose: negative and Other (1% Lidocaine)  Additional Notes Patient identified.  Risk benefits discussed including failed block, incomplete pain control, headache, nerve damage, paralysis, blood pressure changes, nausea, vomiting, reactions to medication both toxic or allergic, and postpartum back pain.  Patient expressed understanding and wished to proceed.  All questions were answered.  Sterile technique used throughout procedure and epidural site dressed with sterile barrier dressing. No paresthesia or other complications noted. The patient did not experience any signs of intravascular injection such as tinnitus or metallic taste in mouth nor signs of intrathecal spread such as rapid motor block. Please see nursing notes for vital signs. Reason for block:procedure for pain

## 2017-11-13 NOTE — Progress Notes (Signed)
Patient ID: Lauren Lawson Berges, female   DOB: [birth year], 22 y.o.   MRN: [id number]  Labor Progress Note Lauren Lawson Aull is a 22 y.o. G1P0 at 8050w2d presented with SOL.  S: Patient sitting comfortably in bed with family in the room.   O:  BP 114/72   Pulse 99   Temp 97.8 F (36.6 C) (Oral)   Resp 18   Ht 5\' 7"  (1.702 m)   Wt 66.2 kg (146 lb)   LMP 02/11/2017   SpO2 100%   BMI 22.87 kg/m  EFM: 125/moderate variability/+ acc/- dec  CVE: Dilation: 6 Effacement (%): 90 Cervical Position: Middle Station: 0 Presentation: Vertex Exam by:: Jaclyn PrimeK Wheatley, RN   A&P: 22 y.o. G1P0 5750w2d with SOL #Labor: Progressing well. Manual ROM. Pitocin. #Pain: Fentanyl #FWB: category I #GBS positive, PCN given at 2008  Larose Hireshristopher Trennepohl, Medical Student 10:28 PM  I confirm that I have verified the information documented in the Med Student's note and that I have also personally reperformed the physical exam and all medical decision making activities. FHR reassuring UCs irregular Will continue to observe  Aviva SignsWilliams, Marie L, CNM

## 2017-11-14 ENCOUNTER — Encounter: Payer: Medicaid Other | Admitting: Certified Nurse Midwife

## 2017-11-14 ENCOUNTER — Encounter (HOSPITAL_COMMUNITY): Payer: Self-pay

## 2017-11-14 LAB — ABO/RH: ABO/RH(D): A POS

## 2017-11-14 LAB — RPR: RPR Ser Ql: NONREACTIVE

## 2017-11-14 MED ORDER — DIBUCAINE 1 % RE OINT: 1.0000 "application " | TOPICAL_OINTMENT | RECTAL | Status: DC | PRN

## 2017-11-14 MED ORDER — IBUPROFEN 600 MG PO TABS
600.0000 mg | ORAL_TABLET | Freq: Four times a day (QID) | ORAL | Status: DC
  Administered 2017-11-14 – 2017-11-15 (×5): 600 mg via ORAL
  Filled 2017-11-14 (×5): qty 1

## 2017-11-14 MED ORDER — SENNOSIDES-DOCUSATE SODIUM 8.6-50 MG PO TABS
2.0000 | ORAL_TABLET | ORAL | Status: DC
  Administered 2017-11-15: 2 via ORAL
  Filled 2017-11-14: qty 2

## 2017-11-14 MED ORDER — DIPHENHYDRAMINE HCL 25 MG PO CAPS: 25.0000 mg | ORAL_CAPSULE | Freq: Four times a day (QID) | ORAL | Status: DC | PRN

## 2017-11-14 MED ORDER — ONDANSETRON HCL 4 MG/2ML IJ SOLN: 4.0000 mg | INTRAMUSCULAR | Status: DC | PRN

## 2017-11-14 MED ORDER — TETANUS-DIPHTH-ACELL PERTUSSIS 5-2.5-18.5 LF-MCG/0.5 IM SUSP: 0.5000 mL | Freq: Once | INTRAMUSCULAR | Status: DC

## 2017-11-14 MED ORDER — ACETAMINOPHEN 325 MG PO TABS
650.0000 mg | ORAL_TABLET | ORAL | Status: DC | PRN
  Administered 2017-11-14 – 2017-11-15 (×2): 650 mg via ORAL
  Filled 2017-11-14: qty 2

## 2017-11-14 MED ORDER — WITCH HAZEL-GLYCERIN EX PADS: 1.0000 "application " | MEDICATED_PAD | CUTANEOUS | Status: DC | PRN

## 2017-11-14 MED ORDER — ONDANSETRON HCL 4 MG PO TABS: 4.0000 mg | ORAL_TABLET | ORAL | Status: DC | PRN

## 2017-11-14 MED ORDER — ZOLPIDEM TARTRATE 5 MG PO TABS: 5.0000 mg | ORAL_TABLET | Freq: Every evening | ORAL | Status: DC | PRN

## 2017-11-14 MED ORDER — PRENATAL MULTIVITAMIN CH
1.0000 | ORAL_TABLET | Freq: Every day | ORAL | Status: DC
  Administered 2017-11-14 – 2017-11-15 (×2): 1 via ORAL
  Filled 2017-11-14 (×2): qty 1

## 2017-11-14 MED ORDER — COCONUT OIL OIL: 1.0000 "application " | TOPICAL_OIL | Status: DC | PRN

## 2017-11-14 MED ORDER — SIMETHICONE 80 MG PO CHEW: 80.0000 mg | CHEWABLE_TABLET | ORAL | Status: DC | PRN

## 2017-11-14 MED ORDER — BENZOCAINE-MENTHOL 20-0.5 % EX AERO
1.0000 "application " | INHALATION_SPRAY | CUTANEOUS | Status: DC | PRN
  Filled 2017-11-14: qty 56

## 2017-11-14 NOTE — Progress Notes (Signed)
Patient ID: [name], female   DOB: 21-Jul-1996, 22 y.o.   MRN: [id number] Sleeping Vitals:   11/14/17 0031 11/14/17 0101 11/14/17 0111 11/14/17 0131  BP: 115/75 104/62  (!) 110/59  Pulse: 96 89  95  Resp: 18 17  18   Temp:   99.5 F (37.5 C)   TempSrc:   Oral   SpO2:      Weight:      Height:       FHR Reactive, category 1 UCs irregular  Will recheck cervix shortly  May need Pitocin augmentation

## 2017-11-14 NOTE — Anesthesia Postprocedure Evaluation (Signed)
Anesthesia Post Note  Patient: Lauren PlumCarley Fare  Procedure(s) Performed: AN AD HOC LABOR EPIDURAL     Patient location during evaluation: Mother Baby Anesthesia Type: Epidural Level of consciousness: awake Pain management: pain level controlled Vital Signs Assessment: post-procedure vital signs reviewed and stable Respiratory status: spontaneous breathing Cardiovascular status: stable Postop Assessment: epidural receding and patient able to bend at knees Anesthetic complications: no    Last Vitals:  Vitals:   11/14/17 0540 11/14/17 0628  BP: 114/75 118/67  Pulse: 75 77  Resp: 18 18  Temp: 36.9 C 36.7 C  SpO2:      Last Pain:  Vitals:   11/14/17 0629  TempSrc:   PainSc: 2    Pain Goal:                 Edison PaceWILKERSON,Levii Hairfield

## 2017-11-14 NOTE — Progress Notes (Signed)
Patient ID: Lauren Lawson, female   DOB: Feb 11, 1996, 22 y.o.   MRN: [id number] Doing well, not feeling much  Vitals:   11/14/17 0101 11/14/17 0111 11/14/17 0131 11/14/17 0201  BP: 104/62  (!) 110/59 122/73  Pulse: 89  95 (!) 106  Resp: 17  18 18   Temp:  99.5 F (37.5 C)    TempSrc:  Oral    SpO2:      Weight:      Height:       FHR stable UCs every 2 min  Dilation: 10 Dilation Complete Date: 11/14/17 Dilation Complete Time: 0203 Effacement (%): 90 Cervical Position: Middle Station: +1 Presentation: Vertex Exam by:: Wynelle BourgeoisMarie Swayze Pries, CNM  Will prepare for delivery

## 2017-11-15 DIAGNOSIS — Z3A39 39 weeks gestation of pregnancy: Secondary | ICD-10-CM

## 2017-11-15 DIAGNOSIS — O99824 Streptococcus B carrier state complicating childbirth: Secondary | ICD-10-CM

## 2017-11-15 LAB — BIRTH TISSUE RECOVERY COLLECTION (PLACENTA DONATION)

## 2017-11-15 MED ORDER — IBUPROFEN 600 MG PO TABS: 600.0000 mg | ORAL_TABLET | Freq: Four times a day (QID) | ORAL | 0 refills | Status: AC

## 2017-11-15 NOTE — Discharge Instructions (Signed)
Vaginal Delivery, Care After °Refer to this sheet in the next few weeks. These instructions provide you with information about caring for yourself after vaginal delivery. Your health care provider may also give you more specific instructions. Your treatment has been planned according to current medical practices, but problems sometimes occur. Call your health care provider if you have any problems or questions. °What can I expect after the procedure? °After vaginal delivery, it is common to have: °· Some bleeding from your vagina. °· Soreness in your abdomen, your vagina, and the area of skin between your vaginal opening and your anus (perineum). °· Pelvic cramps. °· Fatigue. ° °Follow these instructions at home: °Medicines °· Take over-the-counter and prescription medicines only as told by your health care provider. °· If you were prescribed an antibiotic medicine, take it as told by your health care provider. Do not stop taking the antibiotic until it is finished. °Driving ° °· Do not drive or operate heavy machinery while taking prescription pain medicine. °· Do not drive for 24 hours if you received a sedative. °Lifestyle °· Do not drink alcohol. This is especially important if you are breastfeeding or taking medicine to relieve pain. °· Do not use tobacco products, including cigarettes, chewing tobacco, or e-cigarettes. If you need help quitting, ask your health care provider. °Eating and drinking °· Drink at least 8 eight-ounce glasses of water every day unless you are told not to by your health care provider. If you choose to breastfeed your baby, you may need to drink more water than this. °· Eat high-fiber foods every day. These foods may help prevent or relieve constipation. High-fiber foods include: °? Whole grain cereals and breads. °? Brown rice. °? Beans. °? Fresh fruits and vegetables. °Activity °· Return to your normal activities as told by your health care provider. Ask your health care provider  what activities are safe for you. °· Rest as much as possible. Try to rest or take a nap when your baby is sleeping. °· Do not lift anything that is heavier than your baby or 10 lb (4.5 kg) until your health care provider says that it is safe. °· Talk with your health care provider about when you can engage in sexual activity. This may depend on your: °? Risk of infection. °? Rate of healing. °? Comfort and desire to engage in sexual activity. °Vaginal Care °· If you have an episiotomy or a vaginal tear, check the area every day for signs of infection. Check for: °? More redness, swelling, or pain. °? More fluid or blood. °? Warmth. °? Pus or a bad smell. °· Do not use tampons or douches until your health care provider says this is safe. °· Watch for any blood clots that may pass from your vagina. These may look like clumps of dark red, brown, or black discharge. °General instructions °· Keep your perineum clean and dry as told by your health care provider. °· Wear loose, comfortable clothing. °· Wipe from front to back when you use the toilet. °· Ask your health care provider if you can shower or take a bath. If you had an episiotomy or a perineal tear during labor and delivery, your health care provider may tell you not to take baths for a certain length of time. °· Wear a bra that supports your breasts and fits you well. °· If possible, have someone help you with household activities and help care for your baby for at least a few days after   you leave the hospital. °· Keep all follow-up visits for you and your baby as told by your health care provider. This is important. °Contact a health care provider if: °· You have: °? Vaginal discharge that has a bad smell. °? Difficulty urinating. °? Pain when urinating. °? A sudden increase or decrease in the frequency of your bowel movements. °? More redness, swelling, or pain around your episiotomy or vaginal tear. °? More fluid or blood coming from your episiotomy or  vaginal tear. °? Pus or a bad smell coming from your episiotomy or vaginal tear. °? A fever. °? A rash. °? Little or no interest in activities you used to enjoy. °? Questions about caring for yourself or your baby. °· Your episiotomy or vaginal tear feels warm to the touch. °· Your episiotomy or vaginal tear is separating or does not appear to be healing. °· Your breasts are painful, hard, or turn red. °· You feel unusually sad or worried. °· You feel nauseous or you vomit. °· You pass large blood clots from your vagina. If you pass a blood clot from your vagina, save it to show to your health care provider. Do not flush blood clots down the toilet without having your health care provider look at them. °· You urinate more than usual. °· You are dizzy or light-headed. °· You have not breastfed at all and you have not had a menstrual period for 12 weeks after delivery. °· You have stopped breastfeeding and you have not had a menstrual period for 12 weeks after you stopped breastfeeding. °Get help right away if: °· You have: °? Pain that does not go away or does not get better with medicine. °? Chest pain. °? Difficulty breathing. °? Blurred vision or spots in your vision. °? Thoughts about hurting yourself or your baby. °· You develop pain in your abdomen or in one of your legs. °· You develop a severe headache. °· You faint. °· You bleed from your vagina so much that you fill two sanitary pads in one hour. °This information is not intended to replace advice given to you by your health care provider. Make sure you discuss any questions you have with your health care provider. °Document Released: 10/25/2000 Document Revised: 04/10/2016 Document Reviewed: 11/12/2015 °Elsevier Interactive Patient Education © 2018 [employer] ° °

## 2017-11-15 NOTE — Discharge Summary (Signed)
OB Discharge Summary     Patient Name: Jackie PlumCarley Ramaker DOB: July 15, 1996 MRN: [id number]  Date of admission: 11/13/2017 Delivering MD: Aviva SignsWILLIAMS, MARIE L   Date of discharge: 11/15/2017  Admitting diagnosis: 39WKS CTX Intrauterine pregnancy: 1920w3d     Secondary diagnosis:  Active Problems:   Normal labor  Additional problems: GBS+, treated, history of anxiety and depression, on Zoloft      Discharge diagnosis: Term Pregnancy Delivered                                                                                                Post partum procedures:none  Augmentation: none  Complications: None  Hospital course:  Onset of Labor With Vaginal Delivery     [age] y.o. yo G1P1001 at 1120w3d was admitted in Active Labor on 11/13/2017. Patient had an uncomplicated labor course as follows:  Membrane Rupture Time/Date: 10:14 PM ,11/13/2017   Intrapartum Procedures: Episiotomy: None [1]                                         Lacerations:  Labial [10]  Patient had a delivery of a Viable infant. 11/14/2017  Information for the patient's newborn:  Orinda KennerMarsh, Boy Akaysha [[id number]][[id number]]  Delivery Method: Vaginal, Spontaneous(Filed from Delivery Summary)    Patient had an uncomplicated postpartum course.  She is ambulating, tolerating a regular diet, passing flatus, and urinating well. Patient is discharged home in stable condition on 11/15/17.   Physical exam  Vitals:   11/14/17 0628 11/14/17 1007 11/14/17 1832 11/15/17 0610  BP: 118/67 128/84 113/68 117/74  Pulse: 77 76 67 78  Resp: 18 20 16 16   Temp: 98 F (36.7 C) 98.3 F (36.8 C) (!) 97.5 F (36.4 C) (!) 97 F (36.1 C)  TempSrc:  Oral Axillary Axillary  SpO2:      Weight:      Height:       General: alert, cooperative and no distress Lochia: appropriate Uterine Fundus: firm Incision: n/a DVT Evaluation: No evidence of DVT seen on physical exam.   Labs: Lab Results  Component Value Date   WBC 14.2 (H) 11/13/2017   HGB 10.3 (L)  11/13/2017   HCT 32.8 (L) 11/13/2017   MCV 75.1 (L) 11/13/2017   PLT 286 11/13/2017   CMP Latest Ref Rng & Units 08/29/2017  Glucose 65 - 99 mg/dL 782(N105(H)  BUN 6 - 20 mg/dL 9  Creatinine 5.620.44 - 1.301.00 mg/dL 8.65(H0.40(L)  Sodium 846135 - 962145 mmol/L 132(L)  Potassium 3.5 - 5.1 mmol/L 3.5  Chloride 101 - 111 mmol/L 105  CO2 22 - 32 mmol/L 20(L)  Calcium 8.9 - 10.3 mg/dL 9.5(M8.4(L)  Total Protein 6.5 - 8.1 g/dL 6.0(L)  Total Bilirubin 0.3 - 1.2 mg/dL 0.6  Alkaline Phos 38 - 126 U/L 81  AST 15 - 41 U/L 20  ALT 14 - 54 U/L 13(L)    Discharge instruction: per After Visit Summary and "Baby and Me Booklet".  After visit meds:  Allergies as of  11/15/2017   No Known Allergies     Medication List    STOP taking these medications   multivitamin-prenatal 27-0.8 MG Tabs tablet     TAKE these medications   COMFORT FIT MATERNITY SUPP MED Misc 1 Device by Does not apply route daily.   ibuprofen 600 MG tablet Commonly known as:  ADVIL,MOTRIN Take 1 tablet (600 mg total) by mouth every 6 (six) hours.   omeprazole 20 MG capsule Commonly known as:  PRILOSEC Take 1 capsule (20 mg total) by mouth daily.   ondansetron 4 MG tablet Commonly known as:  ZOFRAN Take 1-2 tablets (4-8 mg total) every 8 (eight) hours as needed by mouth for nausea or vomiting (Or diarrhea).   sertraline 25 MG tablet Commonly known as:  ZOLOFT Take 1 tablet (25 mg total) by mouth daily.   terconazole 0.4 % vaginal cream Commonly known as:  TERAZOL 7 Place 1 applicator vaginally at bedtime.       Diet: routine diet  Activity: Advance as tolerated. Pelvic rest for 6 weeks.   Outpatient follow up: with Behavioral Health  Follow up Appt:No future appointments. Follow up Visit:No Follow-up on file.  Postpartum contraception: provided with list of options   Newborn Data: Live born female  Birth Weight: 6 lb 12.8 oz (3084 g) APGAR: 8, 9  Newborn Delivery   Birth date/time:  11/14/2017 03:16:00 Delivery type:  Vaginal,  Spontaneous     Baby Feeding: Breast Disposition:home with mother   Social work to see prior to discharge for history of anxiety and depression.  If no barriers to discharge identified, patient to be discharged.    11/15/2017 Freddrick March, MD

## 2017-11-15 NOTE — Clinical Social Work Maternal (Signed)
  CLINICAL SOCIAL WORK MATERNAL/CHILD NOTE  Patient Details  Name: Lauren Lawson MRN: [id number] Date of Birth: 1996/10/17  Date:  11/15/2017  Clinical Social Worker Initiating Note:  Dede Query lcsw Date/Time: Initiated:  11/15/17/      Child's Name:  Lauren Lawson   Biological Parents:  Mother, Father   Need for Interpreter:  None   Reason for Referral:  Behavioral Health Concerns(history of anxiety and depression)   Address:  [address] [place] [address]    Phone number:  [phone] (home)     Additional phone number:   Household Members/Support Persons (HM/SP):   Household Member/Support Person 1   HM/SP Name Relationship DOB or Age  HM/SP -1 ty [place] significant other    HM/SP -2        HM/SP -3        HM/SP -4        HM/SP -5        HM/SP -6        HM/SP -7        HM/SP -8          Natural Supports (not living in the home):  Immediate Family, Friends, Extended Family   Professional Supports: None   Employment: Unemployed   Type of Work:     Education:  Programmer, systems   Homebound arranged:    Museum/gallery curator Resources:  Medicaid   Other Resources:  Physicist, medical , Christopher Considerations Which May Impact Care:    Strengths:  Ability to meet basic needs , Home prepared for child    Psychotropic Medications:         Pediatrician:       Pediatrician List:   [place]      Pediatrician Fax Number:    Risk Factors/Current Problems:  None   Cognitive State:  Alert , Able to Concentrate    Mood/Affect:  Calm    CSW Assessment: LCSW consulted for history of anxiety and depression.  LCSW met with MOB and FOB at bedside to assess for services.  MOB was breast feeding newborn and appeared comfortable and calm.  LCSW encouraged MOB to discuss history and needs.  MOB reported that her primary doctor had given her zoloft in  past but stated she stopped taking this medication when she became pregnant.  MOB reported that she was not interested in getting back on medication since she was able to feel better off of the medication.  MOB reported that if she changed her mind she would follow up with MD.  MOB reported that she lived with FOB and they also had immediate family close by for additional support.  MOB reported that they had all of the equipment needed to take newborn home at discharge. LCSW provided education regarding Baby Blues vs PMADs and encouraged MOB to evaluate her mental health throughout the postpartum period with the use of the New Mom Checklist developed by [facility] and notify a medical professional if symptoms arise.  LCSW also provided MOB with list of support groups including mom talk, breast feeding support and baby and me for additional support.  RN reported no concerns.     CSW Plan/Description:  No Further Intervention Required/No Barriers to Discharge    Carlean Jews, LCSW 11/15/2017, 10:13 AM

## 2017-11-15 NOTE — Lactation Note (Signed)
This note was copied from a baby's chart. Lactation Consultation Note  Patient Name: Lauren Lawson Reason for consult: Initial assessment;Term;Infant weight loss(4% weight loss )  Baby is 30 hours  As LC entered the room, baby latched with depth, multiple swallows, increased with breast  Compressions. Per mom comfortable. Breast are filling.  Sore nipple and engorgement prevention and tx reviewed.  LC instructed mom on the use hand pump and shells ,  Storage of breast milk.  LC assisted mom to latch on the left breast / side lying. Multiple swallows noted.  Baby still feeding at the end of the consult.  Mother informed of post-discharge support and given phone number to the lactation department, including services for phone call assistance; out-patient appointments; and breastfeeding support group. List of other breastfeeding resources in the community given in the handout. Encouraged mother to call for problems or concerns related to breastfeeding.   Maternal Data Has patient been taught Hand Expression?: Yes Does the patient have breastfeeding experience prior to this delivery?: No  Feeding Feeding Type: Breast Fed Length of feed: 35 min  LATCH Score Latch: Grasps breast easily, tongue down, lips flanged, rhythmical sucking.  Audible Swallowing: A few with stimulation  Type of Nipple: Everted at rest and after stimulation  Comfort (Breast/Nipple): Soft / non-tender  Hold (Positioning): Assistance needed to correctly position infant at breast and maintain latch.  LATCH Score: 8  Interventions Interventions: Breast feeding basics reviewed  Lactation Tools Discussed/Used WIC Program: Yes Pump Review: Setup, frequency, and cleaning;Milk Storage Initiated by:: MAI  Date initiated:: 11/15/17   Consult Status Consult Status: Complete    Lauren Lawson Lawson, 9:28 AM

## 2017-11-20 ENCOUNTER — Ambulatory Visit (INDEPENDENT_AMBULATORY_CARE_PROVIDER_SITE_OTHER): Payer: Medicaid Other | Admitting: Obstetrics & Gynecology

## 2017-11-20 ENCOUNTER — Encounter: Payer: Self-pay | Admitting: Obstetrics & Gynecology

## 2017-11-20 VITALS — BP 129/82 | HR 122 | Temp 98.9°F | Resp 16 | Ht 66.0 in | Wt 133.0 lb

## 2017-11-20 DIAGNOSIS — N61 Mastitis without abscess: Secondary | ICD-10-CM

## 2017-11-20 DIAGNOSIS — O864 Pyrexia of unknown origin following delivery: Secondary | ICD-10-CM

## 2017-11-20 MED ORDER — DICLOXACILLIN SODIUM 500 MG PO CAPS: 500.0000 mg | ORAL_CAPSULE | Freq: Four times a day (QID) | ORAL | 0 refills | Status: DC

## 2017-11-20 NOTE — Progress Notes (Signed)
   Subjective:    Patient ID: Lauren Lawson, female    DOB: 1996-07-03, 22 y.o.   MRN: [id number]  HPI 22 yo single P1 here 8 days after a vaginal delivery with the issue of a fever of 103 last night. She feels achy. She is breastfeeding, not emptying her breasts fully. She has been taking Tylenol.  Review of Systems     Objective:   Physical Exam Breathing, conversing, and ambulating normally Well nourished, well hydrated White female, no apparent distress Both breasts full and leaking Redness of the left breast on the underside Abd (with deep palpation towards the uterus)- normal     Assessment & Plan:  Mastitis of the right breast Rec continue Tylenol, increase po water intake, and dicloxacillin 500 mg QID Come back 1 week or sooner prn

## 2017-12-12 ENCOUNTER — Ambulatory Visit: Payer: Medicaid Other | Admitting: Advanced Practice Midwife

## 2019-06-05 IMAGING — US US MFM OB COMP +14 WKS
1 series · 14 of 28 positions shown · non-contrast
Comparison: none

[Series 1: us mfm ob comp +14 wks · 14 of 106 slices shown]
[im 4/106]
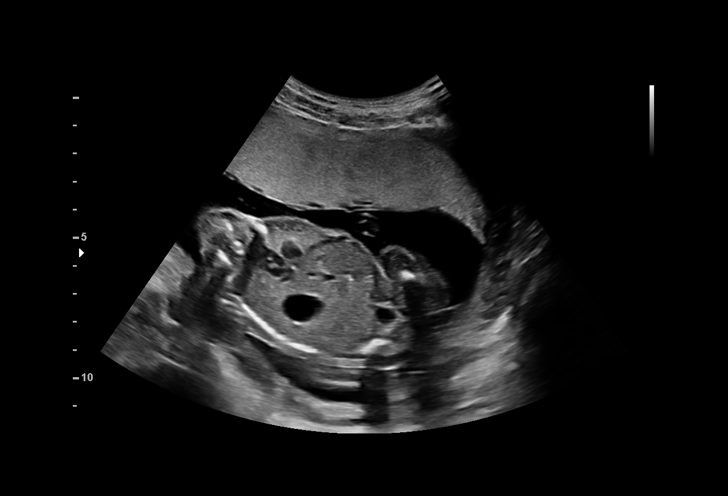
[im 12/106]
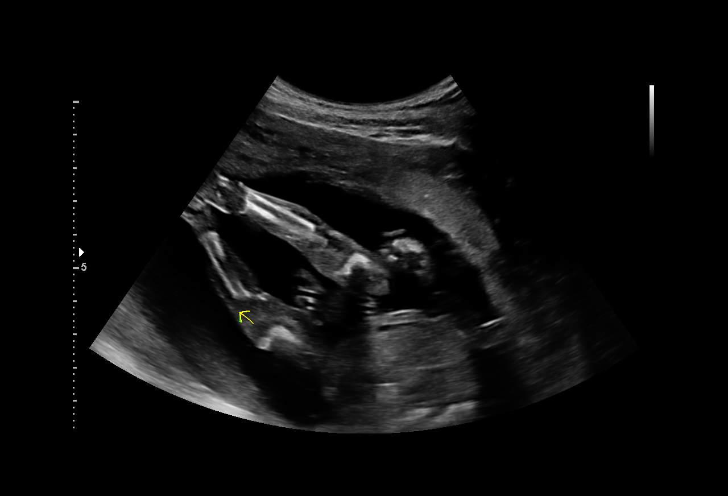
[im 20/106]
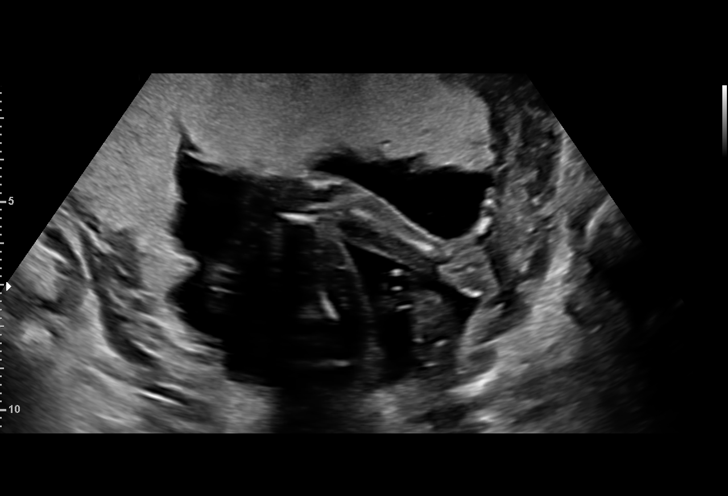
[im 28/106]
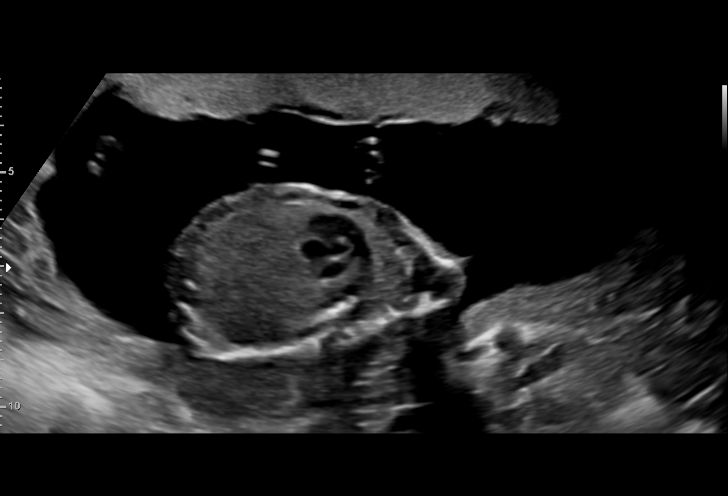
[im 36/106]
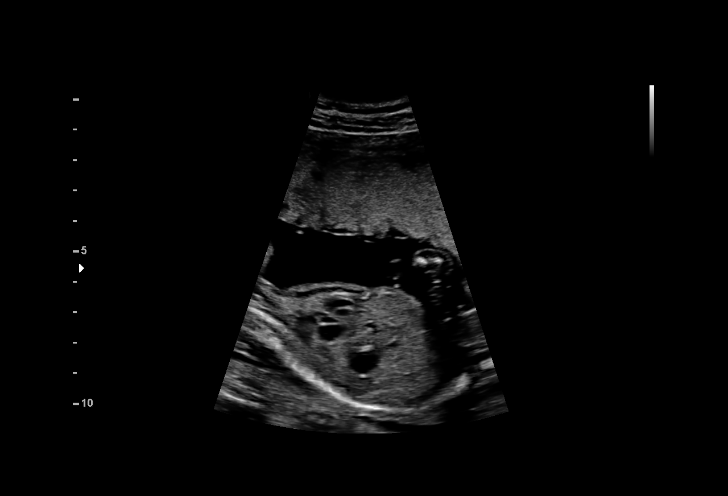
[im 43/106]
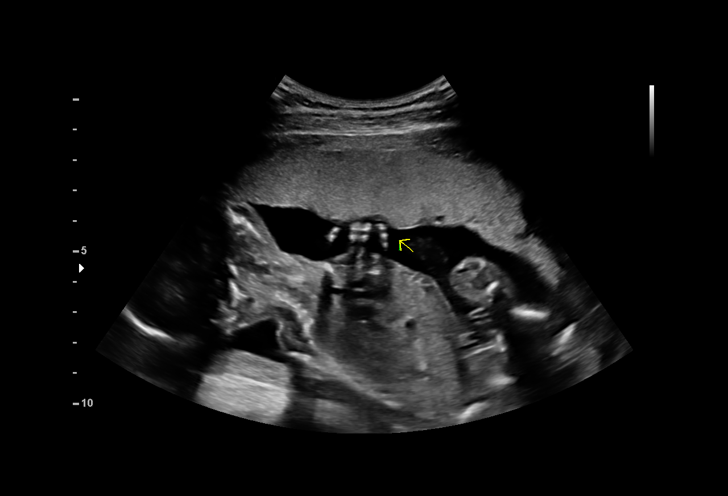
[im 51/106]
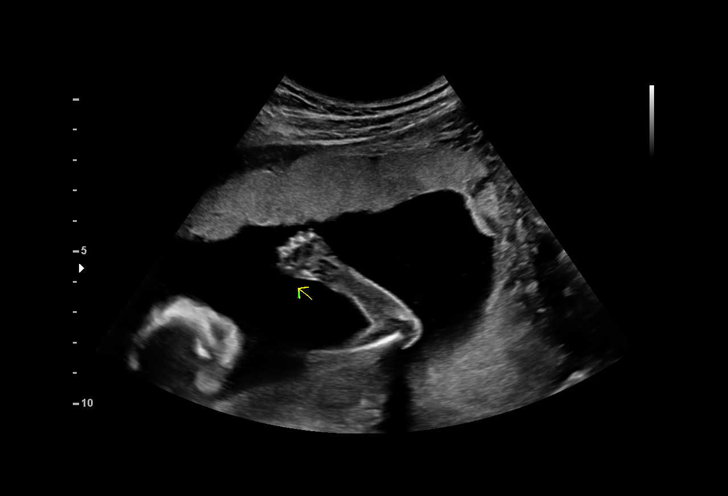
[im 59/106]
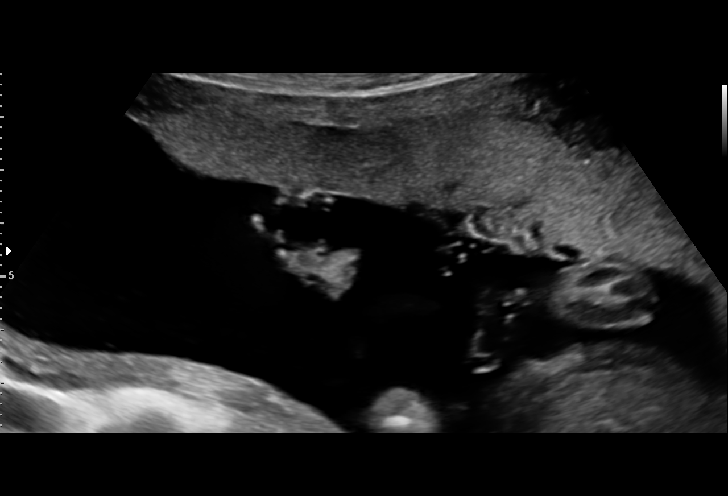
[im 67/106]
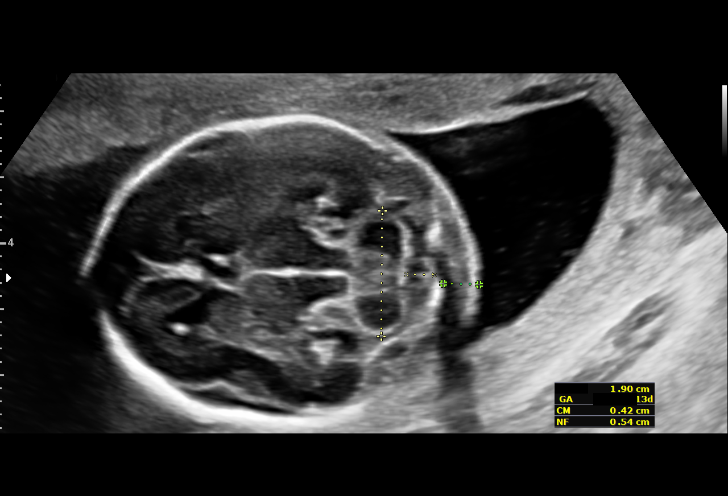
[im 74/106]
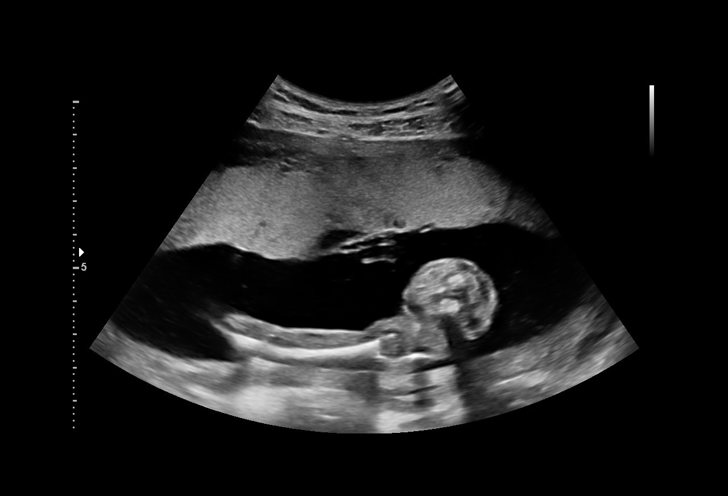
[im 82/106]
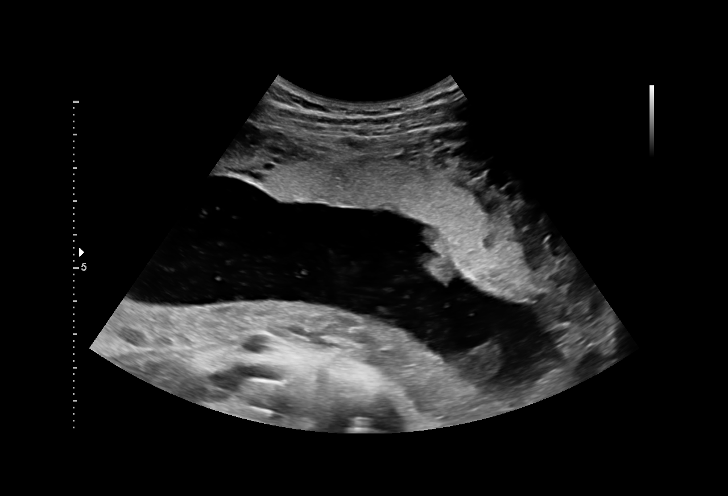
[im 90/106]
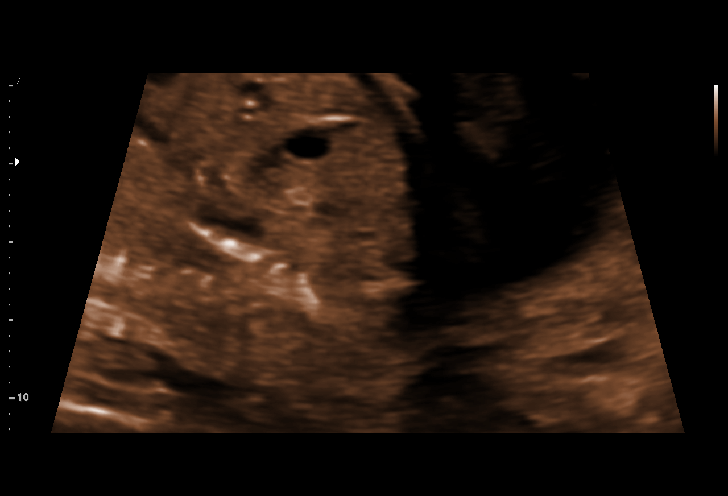
[im 98/106]
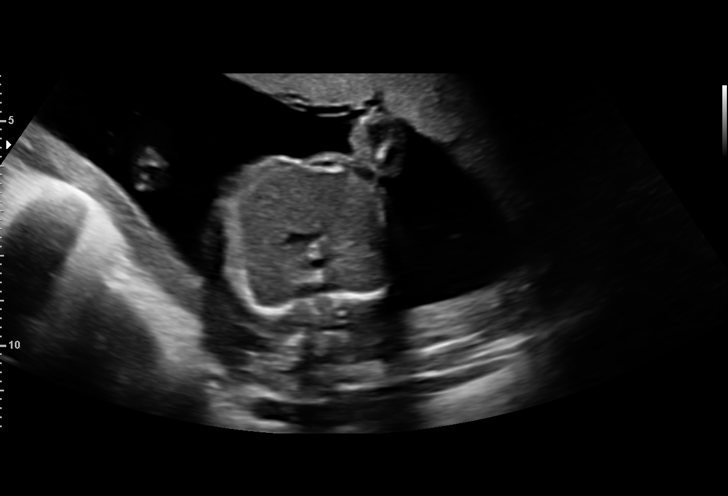
[im 106/106]
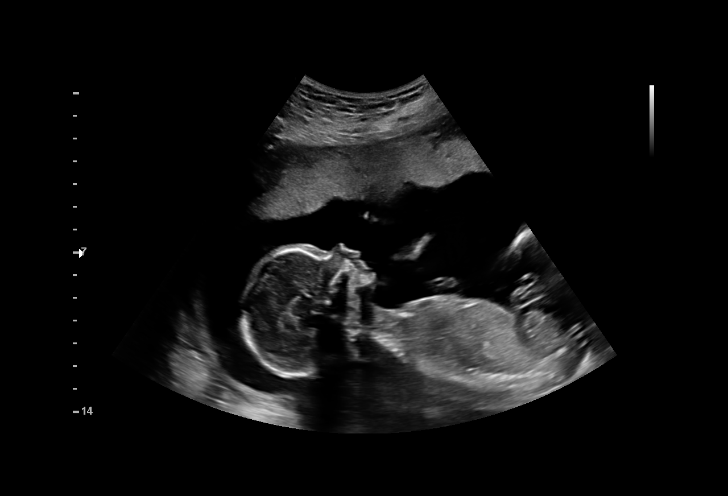

[14 of 28 positions shown; findings below may reference images not displayed]

Indications

19 weeks gestation of pregnancy
Encounter for fetal anatomic survey
OB History

Blood Type:            Height:  5'7"   Weight (lb):  115      BMI:
Gravidity:    1
Fetal Evaluation

Num Of Fetuses:     1
Fetal Heart         121
Rate(bpm):
Cardiac Activity:   Observed
Presentation:       Variable
Placenta:           Anterior, above cervical os
P. Cord Insertion:  Visualized

Amniotic Fluid
AFI FV:      Subjectively within normal limits

Largest Pocket(cm)
4.15
Biometry

BPD:      45.5  mm     G. Age:  19w 5d         46  %    CI:        73.67   %   70 - 86
FL/HC:      17.0   %   16.8 -
HC:      168.4  mm     G. Age:  19w 4d         25  %    HC/AC:      1.21       1.09 -
AC:      138.7  mm     G. Age:  19w 2d         26  %    FL/BPD:     63.1   %
FL:       28.7  mm     G. Age:  18w 6d         12  %    FL/AC:      20.7   %   20 - 24
HUM:      30.6  mm     G. Age:  20w 1d         60  %
CER:      18.9  mm     G. Age:  18w 3d          9  %
NFT:       5.4  mm
CM:        4.1  mm

Est. FW:     275  gm    0 lb 10 oz      34  %
Gestational Age

LMP:           19w 6d       Date:   02/11/17                 EDD:   11/18/17
U/S Today:     19w 3d                                        EDD:   11/21/17
Best:          19w 6d    Det. By:   LMP  (02/11/17)          EDD:   11/18/17
Anatomy

Cranium:               Appears normal         Aortic Arch:            Appears normal
Cavum:                 Appears normal         Ductal Arch:            Appears normal
Ventricles:            Appears normal         Diaphragm:              Appears normal
Choroid Plexus:        Appears normal         Stomach:                Appears normal, left
sided
Cerebellum:            Appears normal         Abdomen:                Appears normal
Posterior Fossa:       Appears normal         Abdominal Wall:         Appears nml (cord
insert, abd wall)
Nuchal Fold:           Appears normal         Cord Vessels:           Appears normal (3
vessel cord)
Face:                  Appears normal         Kidneys:                Appear normal
(orbits and profile)
Lips:                  Appears normal         Bladder:                Appears normal
Thoracic:              Appears normal         Spine:                  Not well visualized
Heart:                 Appears normal         Upper Extremities:      Appears normal
(4CH, axis, and situs
RVOT:                  Appears normal         Lower Extremities:      Appears normal
LVOT:                  Appears normal

Other:  Parents do not wish to know sex of fetus. Fetus appears to be a male.
Heels and 5th digit visualized. Nasal bone visualized. Open hands
visualized. Technically difficult due to fetal position.
Cervix Uterus Adnexa

Cervix
Length:           3.77  cm.
Normal appearance by transabdominal scan.

Uterus
No abnormality visualized.

Left Ovary
Within normal limits.

Right Ovary
Within normal limits.
Impression

Singleton intrauterine pregnancy at 19+6 weeks by LMP
dates, here for anatomic survey
Review of the anatomy shows no sonographic markers for
aneuploidy or structural anomalies
Spine evaluations should be considered suboptimal
secondary to fetal position
Amniotic fluid volume is normal
Estimated fetal weight is 275g which is growth in the 34th
percentile
Recommendations

Recommend using LMP dates; please review early US or
send documentation
Repeat scan in 4 weeks to complete anatomic survey

## 2019-10-23 ENCOUNTER — Other Ambulatory Visit: Payer: Self-pay

## 2019-10-23 ENCOUNTER — Emergency Department (HOSPITAL_COMMUNITY)
Admission: EM | Admit: 2019-10-23 | Discharge: 2019-10-23 | Disposition: A | Discharge status: Home / Self Care | Payer: Medicaid Other | Attending: Emergency Medicine | Admitting: Emergency Medicine

## 2019-10-23 ENCOUNTER — Emergency Department (HOSPITAL_COMMUNITY): Payer: Medicaid Other

## 2019-10-23 ENCOUNTER — Encounter (HOSPITAL_COMMUNITY): Payer: Self-pay | Admitting: Emergency Medicine

## 2019-10-23 DIAGNOSIS — Z20828 Contact with and (suspected) exposure to other viral communicable diseases: Secondary | ICD-10-CM | POA: Insufficient documentation

## 2019-10-23 DIAGNOSIS — R509 Fever, unspecified: Secondary | ICD-10-CM | POA: Insufficient documentation

## 2019-10-23 DIAGNOSIS — R1030 Lower abdominal pain, unspecified: Secondary | ICD-10-CM | POA: Insufficient documentation

## 2019-10-23 DIAGNOSIS — R0602 Shortness of breath: Secondary | ICD-10-CM | POA: Diagnosis not present

## 2019-10-23 DIAGNOSIS — K625 Hemorrhage of anus and rectum: Secondary | ICD-10-CM | POA: Diagnosis present

## 2019-10-23 DIAGNOSIS — R197 Diarrhea, unspecified: Secondary | ICD-10-CM | POA: Diagnosis not present

## 2019-10-23 HISTORY — DX: Herpesviral infection, unspecified: B00.9

## 2019-10-23 LAB — CBC
HCT: 36.2 % (ref 36.0–46.0)
Hemoglobin: 11.5 g/dL — ABNORMAL LOW (ref 12.0–15.0)
MCH: 26.9 pg (ref 26.0–34.0)
MCHC: 31.8 g/dL (ref 30.0–36.0)
MCV: 84.6 fL (ref 80.0–100.0)
Platelets: 177 10*3/uL (ref 150–400)
RBC: 4.28 MIL/uL (ref 3.87–5.11)
RDW: 13.6 % (ref 11.5–15.5)
WBC: 4.6 10*3/uL (ref 4.0–10.5)
nRBC: 0 % (ref 0.0–0.2)

## 2019-10-23 LAB — URINALYSIS, ROUTINE W REFLEX MICROSCOPIC
Bilirubin Urine: NEGATIVE
Glucose, UA: NEGATIVE mg/dL
Hgb urine dipstick: NEGATIVE
Ketones, ur: 5 mg/dL — AB
Leukocytes,Ua: NEGATIVE
Nitrite: NEGATIVE
Protein, ur: NEGATIVE mg/dL
Specific Gravity, Urine: 1.012 (ref 1.005–1.030)
pH: 8 (ref 5.0–8.0)

## 2019-10-23 LAB — COMPREHENSIVE METABOLIC PANEL
ALT: 14 U/L (ref 0–44)
AST: 19 U/L (ref 15–41)
Albumin: 4.3 g/dL (ref 3.5–5.0)
Alkaline Phosphatase: 40 U/L (ref 38–126)
Anion gap: 12 (ref 5–15)
BUN: 14 mg/dL (ref 6–20)
CO2: 18 mmol/L — ABNORMAL LOW (ref 22–32)
Calcium: 8.7 mg/dL — ABNORMAL LOW (ref 8.9–10.3)
Chloride: 107 mmol/L (ref 98–111)
Creatinine, Ser: 0.78 mg/dL (ref 0.44–1.00)
GFR calc Af Amer: >60 mL/min (ref >60)
GFR calc non Af Amer: >60 mL/min (ref >60)
Glucose, Bld: 96 mg/dL (ref 70–99)
Potassium: 3.4 mmol/L — ABNORMAL LOW (ref 3.5–5.1)
Sodium: 137 mmol/L (ref 135–145)
Total Bilirubin: 1 mg/dL (ref 0.3–1.2)
Total Protein: 6.9 g/dL (ref 6.5–8.1)

## 2019-10-23 LAB — LACTIC ACID, PLASMA
Lactic Acid, Venous: 1.8 mmol/L (ref 0.5–1.9)
Lactic Acid, Venous: 1.1 mmol/L (ref 0.5–1.9)

## 2019-10-23 LAB — TYPE AND SCREEN
ABO/RH(D): A POS
Antibody Screen: NEGATIVE

## 2019-10-23 LAB — POC SARS CORONAVIRUS 2 AG -  ED: SARS Coronavirus 2 Ag: NEGATIVE

## 2019-10-23 LAB — PREGNANCY, URINE: Preg Test, Ur: NEGATIVE

## 2019-10-23 LAB — PROTIME-INR
INR: 1 (ref 0.8–1.2)
Prothrombin Time: 13.2 seconds (ref 11.4–15.2)

## 2019-10-23 LAB — APTT: aPTT: 27 seconds (ref 24–36)

## 2019-10-23 MED ORDER — SODIUM CHLORIDE 0.9 % IV BOLUS (SEPSIS)
1000.0000 mL | Freq: Once | INTRAVENOUS | Status: AC
  Administered 2019-10-23: 1000 mL via INTRAVENOUS

## 2019-10-23 MED ORDER — MORPHINE SULFATE (PF) 4 MG/ML IV SOLN
4.0000 mg | Freq: Once | INTRAVENOUS | Status: AC
  Administered 2019-10-23: 4 mg via INTRAVENOUS
  Filled 2019-10-23: qty 1

## 2019-10-23 MED ORDER — ONDANSETRON 4 MG PO TBDP: 4.0000 mg | ORAL_TABLET | Freq: Three times a day (TID) | ORAL | 0 refills | Status: DC | PRN

## 2019-10-23 MED ORDER — DICYCLOMINE HCL 20 MG PO TABS: 20.0000 mg | ORAL_TABLET | Freq: Three times a day (TID) | ORAL | 0 refills | Status: DC | PRN

## 2019-10-23 MED ORDER — IOHEXOL 300 MG/ML  SOLN
100.0000 mL | Freq: Once | INTRAMUSCULAR | Status: AC | PRN
  Administered 2019-10-23: 18:00:00 100 mL via INTRAVENOUS

## 2019-10-23 NOTE — ED Notes (Signed)
Unable to place pt on cardiac monitor, pt in room without cardiac monitoring.

## 2019-10-23 NOTE — ED Triage Notes (Signed)
Patient brought in via EMS from home. Patient alert and oriented. Patient c/o intermittent abd pain with exterme lower back pain. Per patient started having diarrhea yesterday. Patient reports bright red bloody stools with clots that started last night. Patient reports that she now feels tried, nausea, and generalized weakness. Patient has temp of 103 upon EMS arrival. Patient original blood pressure 102/60. Patient given zofran, 459mL of NS, and tylenol 1000mg .

## 2019-10-23 NOTE — ED Provider Notes (Signed)
Emergency Department Provider Note   I have reviewed the triage vital signs and the nursing notes.   HISTORY  Chief Complaint Rectal Bleeding   HPI Lauren Lawson is a 23 y.o. female with PMH of anxiety presents to the emergency department for evaluation of lower abdominal pain with fever and bloody diarrhea.  Patient's bloody diarrhea began yesterday with lower abdominal discomfort.  She is currently on her menstrual cycle which came a couple of days early.  She denies any UTI symptoms.  She is not having pain radiate to her flanks but does feel like the pain radiates to the middle of her lower back.  She is not experiencing any pain into the legs, weakness, numbness.  She is not having vomiting.  No sick contacts.  She had multiple episodes of bright red blood in her bowel movements with some blood clots when she wipes.  She denies any black, tarry stools.  She has never had this happen to her before.  She has also developed some shortness of breath starting today but denies cough, congestion, sore throat.  She has been very careful in the community and has not had known contact with a Covid positive individual.   Patient ultimately arrived by EMS.  She was given 400 mL of IV fluids along with Zofran and 1000 mg of Tylenol en route.   Past Medical History:  Diagnosis Date  . Anxiety   . HSV-2 (herpes simplex virus 2) infection   . 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 Normal labor 11/13/2017  . Anxiety disorder affecting pregnancy, antepartum 07/04/2017  . Depression affecting pregnancy 07/04/2017  . Pregnancy, supervision of first, unspecified trimester 04/28/2017    History reviewed. No pertinent surgical history.  Allergies Patient has no known allergies.  Family History  Problem Relation Age of Onset  . Hypertension Father     Social History Social History   Tobacco Use  . Smoking status: Current Some Day Smoker      Types: Cigarettes  . Smokeless tobacco: Never Used  Substance Use Topics  . Alcohol use: Yes    Comment: rare  . Drug use: Yes    Types: Marijuana    Review of Systems  Constitutional: Positive fever/chills and generalized weakness.  Eyes: No visual changes. ENT: No sore throat. Cardiovascular: Denies chest pain. Respiratory: Positive shortness of breath. Gastrointestinal: Positive lower abdominal pain.  Positive nausea, no vomiting.  No diarrhea.  No constipation. BRBPR.  Genitourinary: Negative for dysuria. Musculoskeletal: Positive for back pain. Skin: Negative for rash. Neurological: Negative for headaches, focal weakness or numbness.  10-point ROS otherwise negative.  ____________________________________________   PHYSICAL EXAM:  VITAL SIGNS: ED Triage Vitals  Enc Vitals Group     BP 10/23/19 1457 105/69     Pulse Rate 10/23/19 1457 (!) 107     Resp 10/23/19 1457 20     Temp 10/23/19 1457 (!) 101.5 F (38.6 C)     Temp Source 10/23/19 1457 Oral     SpO2 10/23/19 1457 100 %     Weight 10/23/19 1458 135 lb (61.2 kg)     Height 10/23/19 1458 5\' 7"  (1.702 m)   Constitutional: Alert and oriented. Well appearing and in no acute distress. Eyes: Conjunctivae are normal.  Head: Atraumatic. Nose: No congestion/rhinnorhea. Mouth/Throat: Mucous membranes are slightly dry.  Neck: No stridor.   Cardiovascular: Tachycardia. Good peripheral circulation. Grossly normal heart  sounds.   Respiratory: Increased respiratory effort.  No retractions. Lungs CTAB. Gastrointestinal: Soft with moderate suprapubic tenderness. No rebound or guarding. No distention. Rectal exam performed after verbal consent and with nurse chaperone. No visible perirectal abscess. No tenderness on DRE. No gross blood or melena.  Musculoskeletal: No gross deformities of extremities. Neurologic:  Normal speech and language.  Skin:  Skin is warm, dry and intact. No rash  noted.  ____________________________________________   LABS (all labs ordered are listed, but only abnormal results are displayed)  Labs Reviewed  COMPREHENSIVE METABOLIC PANEL - Abnormal; Notable for the following components:      Result Value   Potassium 3.4 (*)    CO2 18 (*)    Calcium 8.7 (*)    All other components within normal limits  CBC - Abnormal; Notable for the following components:   Hemoglobin 11.5 (*)    All other components within normal limits  URINALYSIS, ROUTINE W REFLEX MICROSCOPIC - Abnormal; Notable for the following components:   Ketones, ur 5 (*)    All other components within normal limits  CULTURE, BLOOD (ROUTINE X 2)  CULTURE, BLOOD (ROUTINE X 2)  URINE CULTURE  NOVEL CORONAVIRUS, NAA (HOSP ORDER, SEND-OUT TO REF LAB; TAT 18-24 HRS)  LACTIC ACID, PLASMA  LACTIC ACID, PLASMA  APTT  PROTIME-INR  PREGNANCY, URINE  POC SARS CORONAVIRUS 2 AG -  ED  POC OCCULT BLOOD, ED  TYPE AND SCREEN   ____________________________________________  RADIOLOGY  CT ABDOMEN PELVIS W CONTRAST  Result Date: 10/23/2019 CLINICAL DATA:  intermittent abd pain with exterme lower back pain. Per patient started having diarrhea yesterday. Patient reports bright red bloody stools with clots that started last night. Patient reports that she now feels tried, nausea, and generalized weakness. Patient has temp of 103 upon EMS arrival. Patient original blood pressure 102/60. Denies prior history of surgery. EXAM: CT ABDOMEN AND PELVIS WITH CONTRAST TECHNIQUE: Multidetector CT imaging of the abdomen and pelvis was performed using the standard protocol following bolus administration of intravenous contrast. CONTRAST:  OMNIPAQUE IOHEXOL 300 MG/ML  SOLN COMPARISON:  None. FINDINGS: Lower chest: Clear lung bases.  Heart normal in size. Hepatobiliary: No focal liver abnormality is seen. No gallstones, gallbladder wall thickening, or biliary dilatation. Pancreas: Unremarkable. No pancreatic  ductal dilatation or surrounding inflammatory changes. Spleen: Normal in size without focal abnormality. Adrenals/Urinary Tract: Adrenal glands are unremarkable. Kidneys are normal, without renal calculi, focal lesion, or hydronephrosis. Bladder is unremarkable. Stomach/Bowel: Stomach is within normal limits. Appendix appears normal. No evidence of bowel wall thickening, distention, or inflammatory changes. Vascular/Lymphatic: No significant vascular findings are present. No enlarged abdominal or pelvic lymph nodes. Reproductive: Uterus and bilateral adnexa are unremarkable. Other: No abdominal wall hernia or abnormality. No abdominopelvic ascites. Musculoskeletal: No acute or significant osseous findings. IMPRESSION: 1. Normal enhanced CT scan of the abdomen and pelvis. Electronically Signed   By: Amie Portland M.D.   On: 10/23/2019 18:19   DG Chest Port 1 View  Result Date: 10/23/2019 CLINICAL DATA:  Shortness of breath EXAM: PORTABLE CHEST 1 VIEW COMPARISON:  None. FINDINGS: The heart size and mediastinal contours are within normal limits. Both lungs are clear. The visualized skeletal structures are unremarkable. IMPRESSION: No acute cardiopulmonary findings. Electronically Signed   By: Duanne Guess M.D.   On: 10/23/2019 15:57    ____________________________________________   PROCEDURES  Procedure(s) performed:   Procedures  None  ____________________________________________   INITIAL IMPRESSION / ASSESSMENT AND PLAN / ED COURSE  Pertinent labs & imaging results that were available during my care of the patient were reviewed by me and considered in my medical decision making (see chart for details).   Patient presents the emergency department for evaluation of abdominal pain with fever and bloody diarrhea.  She has tachycardia and mild hypotension with EMS improving with IV fluids in route.  She does also have some shortness of breath which may have some component of anxiety, self  described by the patient, but will investigate further with Covid testing and chest x-ray.   Chest x-ray and CT scan reviewed with no acute findings.  Lactic acid was normal.  Rapid Covid test is negative but given symptoms of diarrhea, shortness of breath, fever I do plan for confirmatory PCR study.  Patient's electrolytes are largely within normal limits.  No leukocytosis.  Patient feeling improved after IV fluids.  I discussed home quarantine until her test results come back and she will follow results in the MyChart app.  We discussed ED return precautions.  ____________________________________________  FINAL CLINICAL IMPRESSION(S) / ED DIAGNOSES  Final diagnoses:  Fever, unspecified fever cause  Diarrhea of presumed infectious origin  Lower abdominal pain  SOB (shortness of breath)     MEDICATIONS GIVEN DURING THIS VISIT:  Medications  sodium chloride 0.9 % bolus 1,000 mL (0 mLs Intravenous Stopped 10/23/19 1645)  morphine 4 MG/ML injection 4 mg (4 mg Intravenous Given 10/23/19 1552)  iohexol (OMNIPAQUE) 300 MG/ML solution 100 mL (100 mLs Intravenous Contrast Given 10/23/19 1742)     NEW OUTPATIENT MEDICATIONS STARTED DURING THIS VISIT:  New Prescriptions   DICYCLOMINE (BENTYL) 20 MG TABLET    Take 1 tablet (20 mg total) by mouth 3 (three) times daily as needed for spasms.   ONDANSETRON (ZOFRAN ODT) 4 MG DISINTEGRATING TABLET    Take 1 tablet (4 mg total) by mouth every 8 (eight) hours as needed.    Note:  This document was prepared using Dragon voice recognition software and may include unintentional dictation errors.  Alona BeneJoshua Breyon Sigg, MD, [facility] Emergency [facility]    Theodore Virgin, Arlyss RepressJoshua G, MD 10/23/19 [phone]

## 2019-10-23 NOTE — Discharge Instructions (Signed)
You were seen in the emergency department today with fever and diarrhea.  Your lab work and CT scan did not show evidence of infection.  I am testing you for COVID-19 and would like you to stay in isolation until your results come back in the MyChart app.  There is information in the discharge paperwork about setting up this app on your phone.  You need a negative test and be free of fever and other symptoms for at least 3 days before coming out of quarantine.  If you develop worsening abdominal pain or other severe symptoms you should return to the emergency department.

## 2019-10-25 LAB — URINE CULTURE: Culture: NO GROWTH

## 2019-10-25 LAB — NOVEL CORONAVIRUS, NAA (HOSP ORDER, SEND-OUT TO REF LAB; TAT 18-24 HRS): SARS-CoV-2, NAA: NOT DETECTED

## 2019-10-28 LAB — CULTURE, BLOOD (ROUTINE X 2)
Culture: NO GROWTH
Special Requests: ADEQUATE

## 2021-09-27 IMAGING — CT CT ABD-PELV W/ CM
2 of 4 series · 16 of 46 positions shown, 18 images · IV contrast (omnipaque)
Comparison: None.

CLINICAL DATA: intermittent abd pain with exterme lower back pain.
Per patient started having diarrhea yesterday. Patient reports
bright red bloody stools with clots that started last night. Patient
reports that she now feels tried, nausea, and generalized weakness.
Patient has temp of 103 upon EMS arrival. Patient original blood
pressure 102/60. Denies prior history of surgery.

EXAM:
CT ABDOMEN AND PELVIS WITH CONTRAST
TECHNIQUE: Multidetector CT imaging of the abdomen and pelvis was performed
using the standard protocol following bolus administration of
intravenous contrast.
CONTRAST:  100mL OMNIPAQUE IOHEXOL 300 MG/ML  SOLN

[Series 3: axial st · axial · 0.86mm/px · z∈[+643,+1058]mm · 13 of 95 slices shown, 15 images]
[im 6/95  soft-tissue]
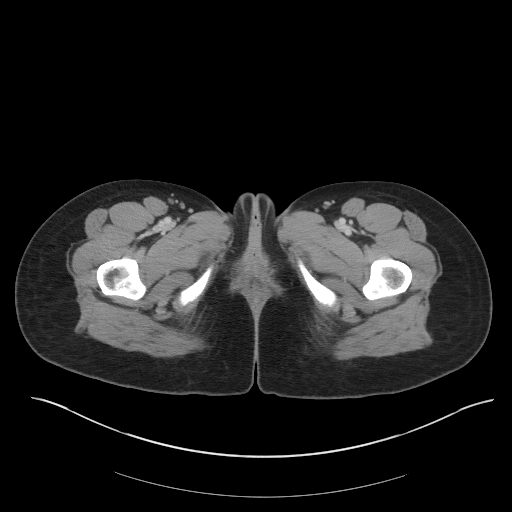
[im 6/95  bone]
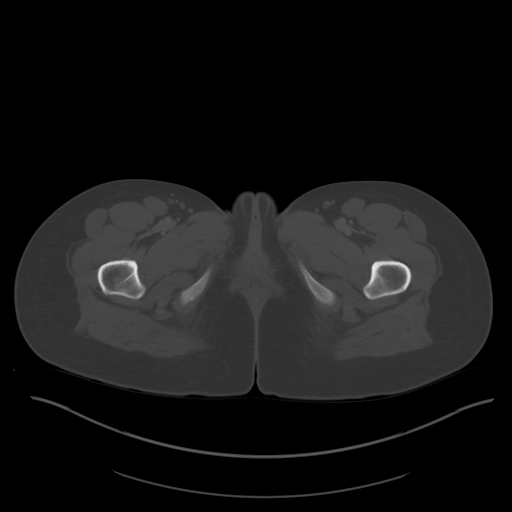
[im 12/95  soft-tissue]
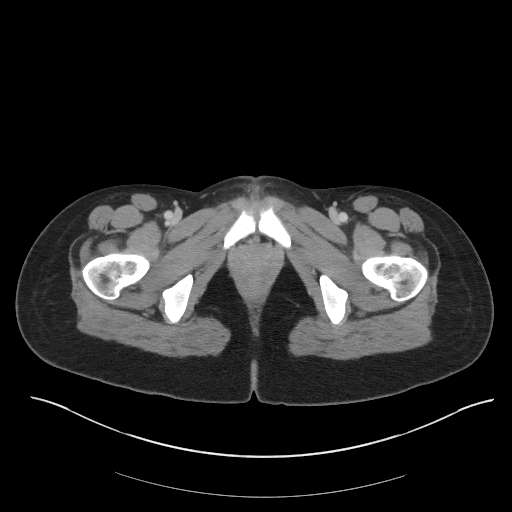
[im 23/95  soft-tissue]
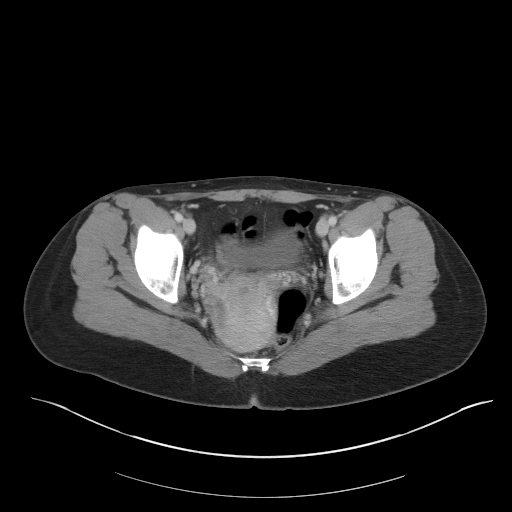
[im 28/95  soft-tissue]
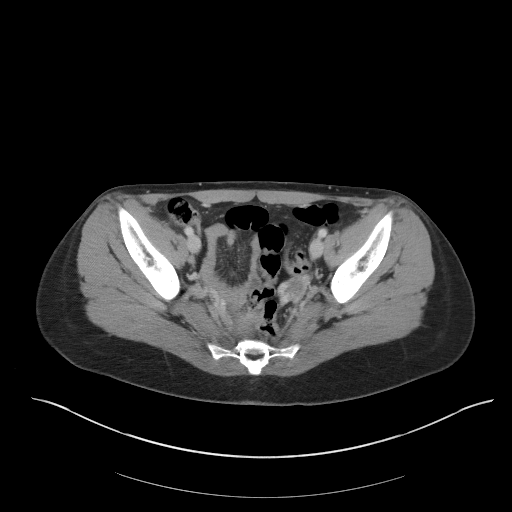
[im 34/95  soft-tissue]
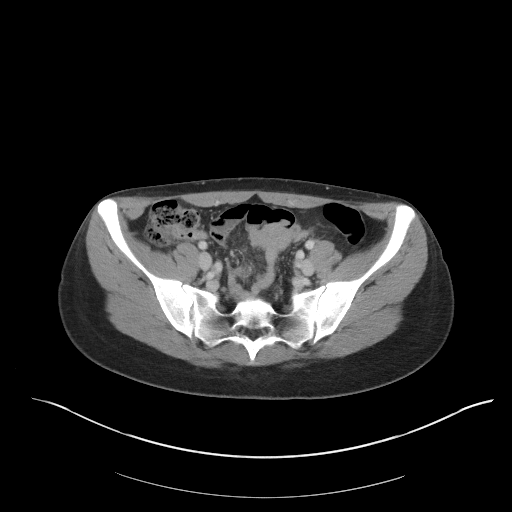
[im 39/95  soft-tissue]
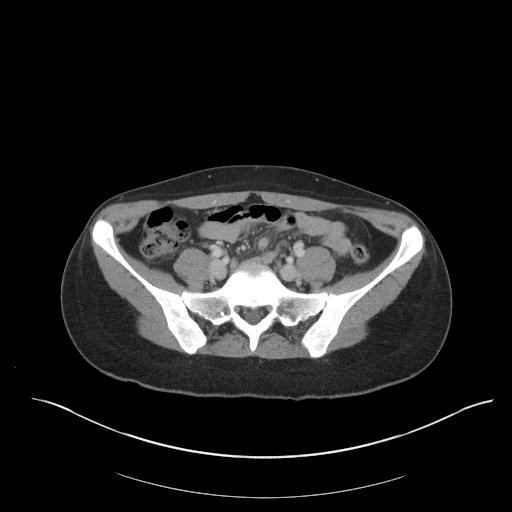
[im 50/95  soft-tissue]
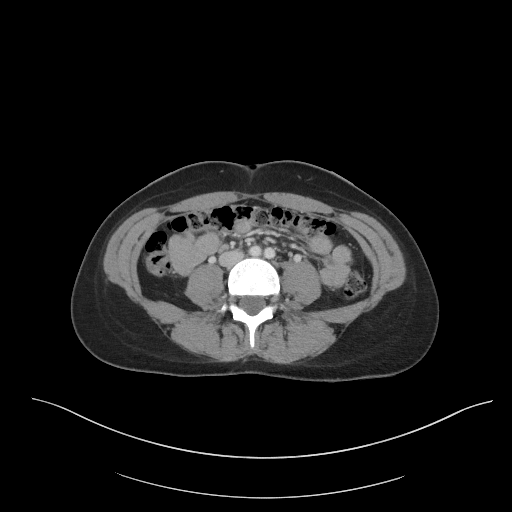
[im 56/95  soft-tissue]
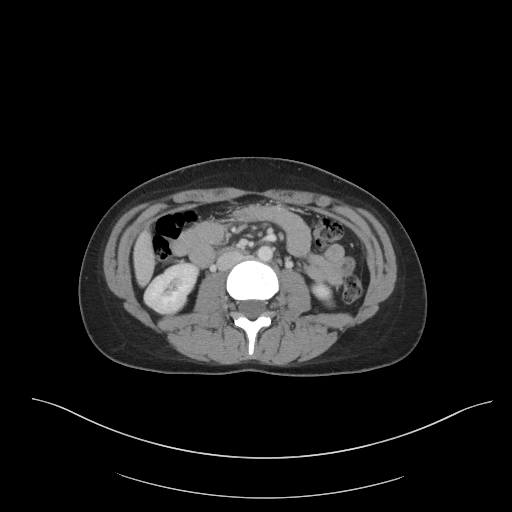
[im 61/95  soft-tissue]
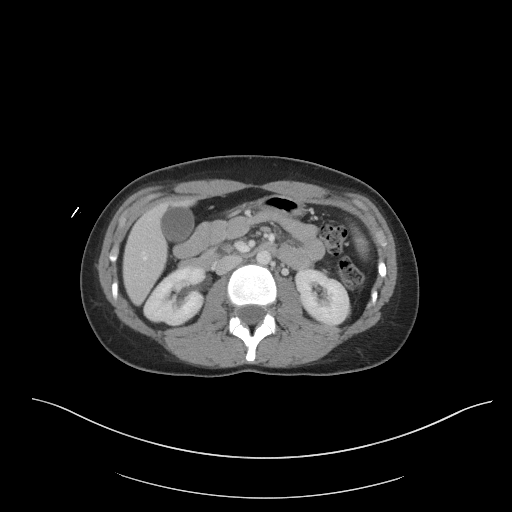
[im 61/95  bone]
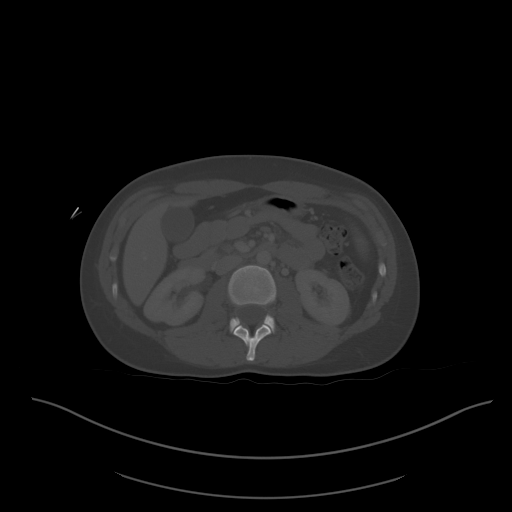
[im 67/95  soft-tissue]
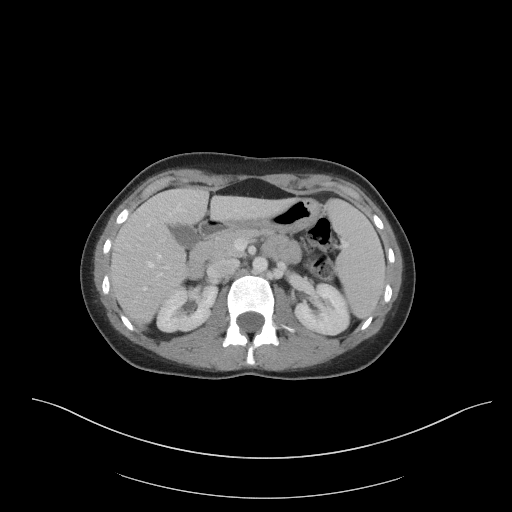
[im 72/95  soft-tissue]
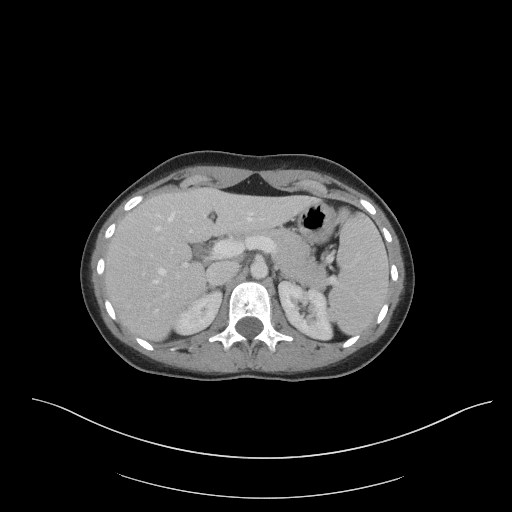
[im 83/95  soft-tissue]
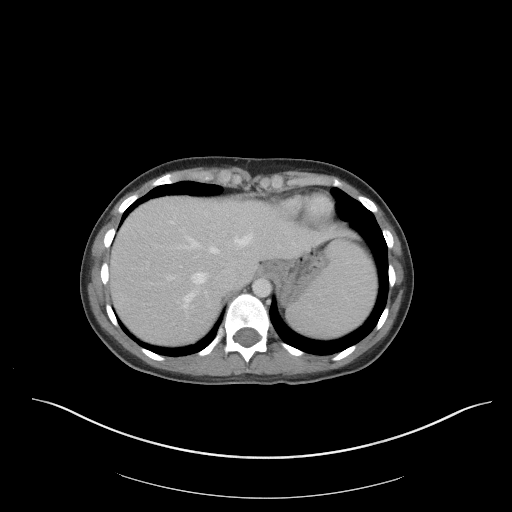
[im 89/95  soft-tissue]
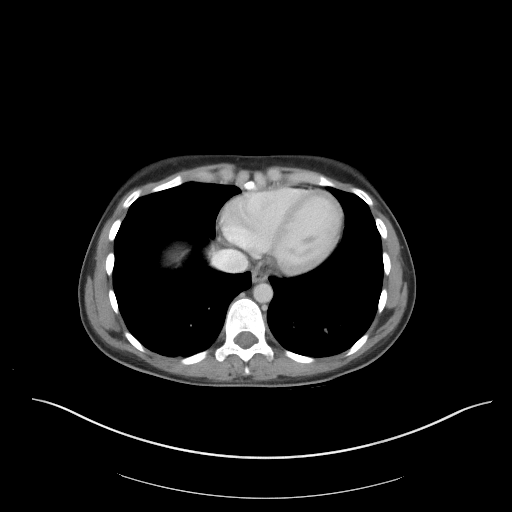

[Series 6: coronal st · coronal · 0.80mm/px · 3 of 76 slices shown]
[im 26/76  soft-tissue]
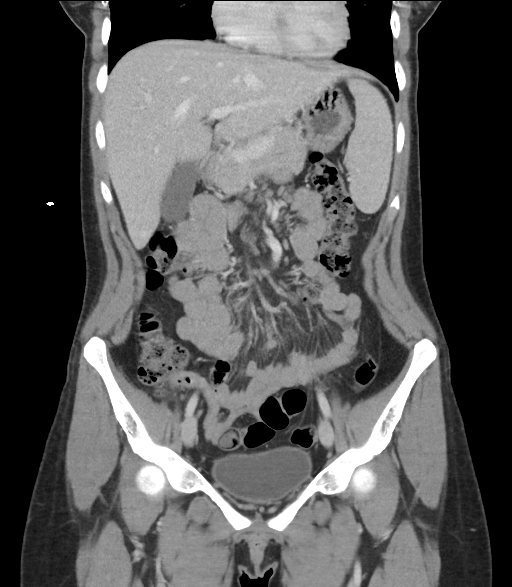
[im 34/76  soft-tissue]
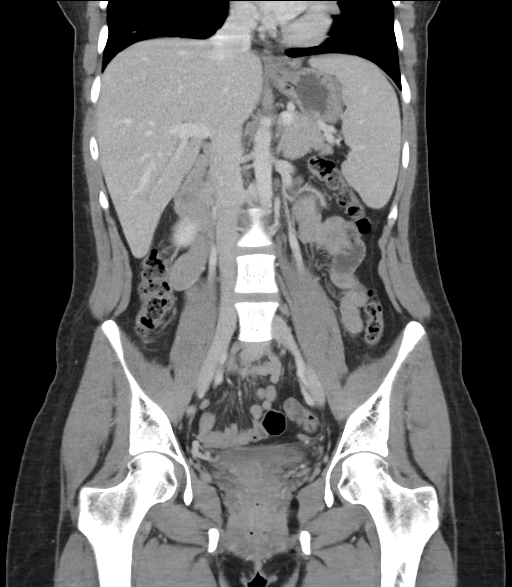
[im 42/76  soft-tissue]
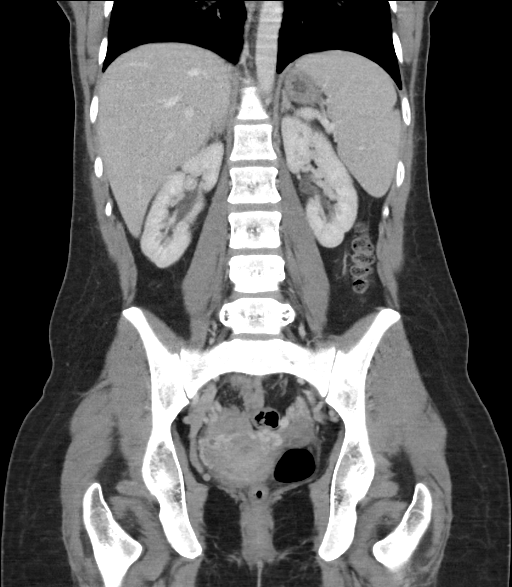

[16 of 46 positions shown; findings below may reference images not displayed]

FINDINGS: Lower chest: Clear lung bases.  Heart normal in size.

Hepatobiliary: No focal liver abnormality is seen. No gallstones,
gallbladder wall thickening, or biliary dilatation.

Pancreas: Unremarkable. No pancreatic ductal dilatation or
surrounding inflammatory changes.

Spleen: Normal in size without focal abnormality.

Adrenals/Urinary Tract: Adrenal glands are unremarkable. Kidneys are
normal, without renal calculi, focal lesion, or hydronephrosis.
Bladder is unremarkable.

Stomach/Bowel: Stomach is within normal limits. Appendix appears
normal. No evidence of bowel wall thickening, distention, or
inflammatory changes.

Vascular/Lymphatic: No significant vascular findings are present. No
enlarged abdominal or pelvic lymph nodes.

Reproductive: Uterus and bilateral adnexa are unremarkable.

Other: No abdominal wall hernia or abnormality. No abdominopelvic
ascites.

Musculoskeletal: No acute or significant osseous findings.
IMPRESSION: 1. Normal enhanced CT scan of the abdomen and pelvis.

## 2024-01-25 ENCOUNTER — Other Ambulatory Visit: Payer: Self-pay

## 2024-01-25 ENCOUNTER — Inpatient Hospital Stay (HOSPITAL_COMMUNITY)
Admission: EM | Admit: 2024-01-25 | Discharge: 2024-01-28 | DRG: 982 | Disposition: A | Discharge status: Home / Self Care | Payer: MEDICAID | Attending: General Surgery | Admitting: General Surgery

## 2024-01-25 ENCOUNTER — Emergency Department (HOSPITAL_COMMUNITY): Payer: Self-pay

## 2024-01-25 ENCOUNTER — Encounter (HOSPITAL_COMMUNITY): Payer: Self-pay

## 2024-01-25 ENCOUNTER — Inpatient Hospital Stay (HOSPITAL_COMMUNITY): Payer: Self-pay

## 2024-01-25 DIAGNOSIS — Z79899 Other long term (current) drug therapy: Secondary | ICD-10-CM | POA: Diagnosis not present

## 2024-01-25 DIAGNOSIS — F17210 Nicotine dependence, cigarettes, uncomplicated: Secondary | ICD-10-CM | POA: Diagnosis present

## 2024-01-25 DIAGNOSIS — S42021A Displaced fracture of shaft of right clavicle, initial encounter for closed fracture: Secondary | ICD-10-CM | POA: Diagnosis present

## 2024-01-25 DIAGNOSIS — R7401 Elevation of levels of liver transaminase levels: Secondary | ICD-10-CM | POA: Diagnosis present

## 2024-01-25 DIAGNOSIS — F1090 Alcohol use, unspecified, uncomplicated: Secondary | ICD-10-CM | POA: Diagnosis present

## 2024-01-25 DIAGNOSIS — F419 Anxiety disorder, unspecified: Secondary | ICD-10-CM | POA: Diagnosis present

## 2024-01-25 DIAGNOSIS — S32039A Unspecified fracture of third lumbar vertebra, initial encounter for closed fracture: Secondary | ICD-10-CM | POA: Diagnosis present

## 2024-01-25 DIAGNOSIS — F319 Bipolar disorder, unspecified: Secondary | ICD-10-CM | POA: Diagnosis present

## 2024-01-25 DIAGNOSIS — S270XXA Traumatic pneumothorax, initial encounter: Secondary | ICD-10-CM | POA: Diagnosis present

## 2024-01-25 DIAGNOSIS — Z8249 Family history of ischemic heart disease and other diseases of the circulatory system: Secondary | ICD-10-CM

## 2024-01-25 DIAGNOSIS — T07XXXA Unspecified multiple injuries, initial encounter: Principal | ICD-10-CM | POA: Diagnosis present

## 2024-01-25 DIAGNOSIS — R0600 Dyspnea, unspecified: Secondary | ICD-10-CM | POA: Diagnosis present

## 2024-01-25 LAB — RAPID URINE DRUG SCREEN, HOSP PERFORMED
Amphetamines: NOT DETECTED
Barbiturates: NOT DETECTED
Benzodiazepines: NOT DETECTED
Cocaine: NOT DETECTED
Opiates: NOT DETECTED
Tetrahydrocannabinol: NOT DETECTED

## 2024-01-25 LAB — COMPREHENSIVE METABOLIC PANEL
ALT: 137 U/L — ABNORMAL HIGH (ref 0–44)
AST: 248 U/L — ABNORMAL HIGH (ref 15–41)
Albumin: 4.3 g/dL (ref 3.5–5.0)
Alkaline Phosphatase: 46 U/L (ref 38–126)
Anion gap: 13 (ref 5–15)
BUN: 11 mg/dL (ref 6–20)
CO2: 23 mmol/L (ref 22–32)
Calcium: 8.7 mg/dL — ABNORMAL LOW (ref 8.9–10.3)
Chloride: 103 mmol/L (ref 98–111)
Creatinine, Ser: 0.79 mg/dL (ref 0.44–1.00)
GFR, Estimated: >60 mL/min (ref >60)
Glucose, Bld: 161 mg/dL — ABNORMAL HIGH (ref 70–99)
Potassium: 3.3 mmol/L — ABNORMAL LOW (ref 3.5–5.1)
Sodium: 139 mmol/L (ref 135–145)
Total Bilirubin: 0.5 mg/dL (ref 0.0–1.2)
Total Protein: 7.1 g/dL (ref 6.5–8.1)

## 2024-01-25 LAB — URINALYSIS, ROUTINE W REFLEX MICROSCOPIC
Bacteria, UA: NONE SEEN
Bilirubin Urine: NEGATIVE
Glucose, UA: NEGATIVE mg/dL
Ketones, ur: NEGATIVE mg/dL
Leukocytes,Ua: NEGATIVE
Nitrite: NEGATIVE
Protein, ur: NEGATIVE mg/dL
Specific Gravity, Urine: 1.016 (ref 1.005–1.030)
pH: 6 (ref 5.0–8.0)

## 2024-01-25 LAB — CBC WITH DIFFERENTIAL/PLATELET
Abs Immature Granulocytes: 0.05 10*3/uL (ref 0.00–0.07)
Basophils Absolute: 0.1 10*3/uL (ref 0.0–0.1)
Basophils Relative: 1 %
Eosinophils Absolute: 0.1 10*3/uL (ref 0.0–0.5)
Eosinophils Relative: 1 %
HCT: 39.1 % (ref 36.0–46.0)
Hemoglobin: 12.8 g/dL (ref 12.0–15.0)
Immature Granulocytes: 1 %
Lymphocytes Relative: 29 %
Lymphs Abs: 2.5 10*3/uL (ref 0.7–4.0)
MCH: 29.1 pg (ref 26.0–34.0)
MCHC: 32.7 g/dL (ref 30.0–36.0)
MCV: 88.9 fL (ref 80.0–100.0)
Monocytes Absolute: 0.4 10*3/uL (ref 0.1–1.0)
Monocytes Relative: 5 %
Neutro Abs: 5.5 10*3/uL (ref 1.7–7.7)
Neutrophils Relative %: 63 %
Platelets: 338 10*3/uL (ref 150–400)
RBC: 4.4 MIL/uL (ref 3.87–5.11)
RDW: 13.4 % (ref 11.5–15.5)
WBC: 8.5 10*3/uL (ref 4.0–10.5)
nRBC: 0 % (ref 0.0–0.2)

## 2024-01-25 LAB — HCG, SERUM, QUALITATIVE: Preg, Serum: NEGATIVE

## 2024-01-25 LAB — ETHANOL: Alcohol, Ethyl (B): 106 mg/dL — ABNORMAL HIGH (ref <10)

## 2024-01-25 MED ORDER — GABAPENTIN 300 MG PO CAPS
300.0000 mg | ORAL_CAPSULE | Freq: Three times a day (TID) | ORAL | Status: DC
  Administered 2024-01-25 (×3): 300 mg via ORAL
  Filled 2024-01-25 (×3): qty 1

## 2024-01-25 MED ORDER — KETOROLAC TROMETHAMINE 15 MG/ML IJ SOLN
15.0000 mg | Freq: Three times a day (TID) | INTRAMUSCULAR | Status: DC
  Administered 2024-01-25 – 2024-01-28 (×11): 15 mg via INTRAVENOUS
  Filled 2024-01-25 (×11): qty 1

## 2024-01-25 MED ORDER — THIAMINE HCL 100 MG/ML IJ SOLN: 100.0000 mg | Freq: Every day | INTRAMUSCULAR | Status: DC

## 2024-01-25 MED ORDER — HYDROMORPHONE HCL 1 MG/ML IJ SOLN
1.0000 mg | INTRAMUSCULAR | Status: DC | PRN
  Administered 2024-01-25 (×3): 1 mg via INTRAVENOUS
  Filled 2024-01-25 (×3): qty 1

## 2024-01-25 MED ORDER — DOCUSATE SODIUM 100 MG PO CAPS
100.0000 mg | ORAL_CAPSULE | Freq: Two times a day (BID) | ORAL | Status: DC
  Administered 2024-01-25 – 2024-01-26 (×4): 100 mg via ORAL
  Filled 2024-01-25 (×5): qty 1

## 2024-01-25 MED ORDER — OXYCODONE HCL 5 MG PO TABS: 5.0000 mg | ORAL_TABLET | ORAL | Status: DC | PRN

## 2024-01-25 MED ORDER — OXYCODONE HCL 5 MG PO TABS
10.0000 mg | ORAL_TABLET | ORAL | Status: DC | PRN
  Administered 2024-01-26 – 2024-01-28 (×8): 10 mg via ORAL
  Filled 2024-01-25 (×9): qty 2

## 2024-01-25 MED ORDER — ADULT MULTIVITAMIN W/MINERALS CH
1.0000 | ORAL_TABLET | Freq: Every day | ORAL | Status: DC
  Administered 2024-01-25 – 2024-01-28 (×3): 1 via ORAL
  Filled 2024-01-25 (×4): qty 1

## 2024-01-25 MED ORDER — HALOPERIDOL LACTATE 5 MG/ML IJ SOLN
5.0000 mg | Freq: Once | INTRAMUSCULAR | Status: AC
  Administered 2024-01-25: 5 mg via INTRAVENOUS
  Filled 2024-01-25: qty 1

## 2024-01-25 MED ORDER — ONDANSETRON HCL 4 MG/2ML IJ SOLN
4.0000 mg | Freq: Once | INTRAMUSCULAR | Status: AC
  Administered 2024-01-25: 4 mg via INTRAVENOUS
  Filled 2024-01-25: qty 2

## 2024-01-25 MED ORDER — HYDROMORPHONE HCL 1 MG/ML IJ SOLN: 1.0000 mg | INTRAMUSCULAR | Status: DC | PRN

## 2024-01-25 MED ORDER — PROCHLORPERAZINE EDISYLATE 10 MG/2ML IJ SOLN: 10.0000 mg | INTRAMUSCULAR | Status: DC | PRN

## 2024-01-25 MED ORDER — ONDANSETRON HCL 4 MG/2ML IJ SOLN: 4.0000 mg | Freq: Four times a day (QID) | INTRAMUSCULAR | Status: DC | PRN

## 2024-01-25 MED ORDER — LORAZEPAM 1 MG PO TABS: 1.0000 mg | ORAL_TABLET | ORAL | Status: AC | PRN

## 2024-01-25 MED ORDER — IOHEXOL 300 MG/ML  SOLN
100.0000 mL | Freq: Once | INTRAMUSCULAR | Status: AC | PRN
  Administered 2024-01-25: 100 mL via INTRAVENOUS

## 2024-01-25 MED ORDER — METHOCARBAMOL 1000 MG/10ML IJ SOLN: 500.0000 mg | Freq: Four times a day (QID) | INTRAMUSCULAR | Status: DC | PRN

## 2024-01-25 MED ORDER — KCL IN DEXTROSE-NACL 20-5-0.45 MEQ/L-%-% IV SOLN
INTRAVENOUS | Status: AC
  Filled 2024-01-25 (×3): qty 1000

## 2024-01-25 MED ORDER — HYDROMORPHONE HCL 1 MG/ML IJ SOLN
1.0000 mg | INTRAMUSCULAR | Status: AC
  Administered 2024-01-25: 1 mg via INTRAVENOUS
  Filled 2024-01-25: qty 1

## 2024-01-25 MED ORDER — LORAZEPAM 2 MG/ML IJ SOLN: 1.0000 mg | INTRAMUSCULAR | Status: AC | PRN

## 2024-01-25 MED ORDER — SIMETHICONE 80 MG PO CHEW: 80.0000 mg | CHEWABLE_TABLET | Freq: Four times a day (QID) | ORAL | Status: DC | PRN

## 2024-01-25 MED ORDER — FENTANYL CITRATE (PF) 100 MCG/2ML IJ SOLN
100.0000 ug | Freq: Once | INTRAMUSCULAR | Status: AC
  Administered 2024-01-25: 100 ug via INTRAVENOUS
  Filled 2024-01-25: qty 2

## 2024-01-25 MED ORDER — ACETAMINOPHEN 325 MG PO TABS
650.0000 mg | ORAL_TABLET | Freq: Four times a day (QID) | ORAL | Status: DC
  Administered 2024-01-25 – 2024-01-27 (×10): 650 mg via ORAL
  Filled 2024-01-25 (×11): qty 2

## 2024-01-25 MED ORDER — FOLIC ACID 1 MG PO TABS
1.0000 mg | ORAL_TABLET | Freq: Every day | ORAL | Status: DC
  Administered 2024-01-25 – 2024-01-28 (×3): 1 mg via ORAL
  Filled 2024-01-25 (×4): qty 1

## 2024-01-25 MED ORDER — THIAMINE MONONITRATE 100 MG PO TABS
100.0000 mg | ORAL_TABLET | Freq: Every day | ORAL | Status: DC
  Administered 2024-01-25 – 2024-01-28 (×3): 100 mg via ORAL
  Filled 2024-01-25 (×4): qty 1

## 2024-01-25 MED ORDER — FENTANYL CITRATE PF 50 MCG/ML IJ SOSY
50.0000 ug | PREFILLED_SYRINGE | Freq: Once | INTRAMUSCULAR | Status: AC
  Administered 2024-01-25: 50 ug via INTRAVENOUS
  Filled 2024-01-25: qty 1

## 2024-01-25 NOTE — Progress Notes (Signed)
 Consult request received for right displaced clavicle fracture. Have discussed with the requesting MD patient's stability, pain control, and ongoing work up. I have reviewed x-rays and developed a provisional plan. Given the associated rib fractures and excissive shortening surgery is recommended.   NPO p MN though schedule is rather full tomorrow. Full consultation to follow in the am.  Myrene Galas, MD [facility], [facility] [phone]

## 2024-01-25 NOTE — Progress Notes (Signed)
 Patient received from [facility] ED, patient is alert and oriented*4, V/S stable, CCMD notified, orientation provided about the unit, Call bell in reach.   01/25/24 1227  Vitals  Temp 98.5 F (36.9 C)  Temp Source Oral  BP 124/84  MAP (mmHg) 94  BP Location Left Arm  BP Method Automatic  Patient Position (if appropriate) Lying  Pulse Rate 70  Pulse Rate Source Monitor  ECG Heart Rate 70  Resp 18  Level of Consciousness  Level of Consciousness Alert  MEWS COLOR  MEWS Score Color Green  Oxygen Therapy  SpO2 94 %  O2 Device Room Air  Pain Assessment  Pain Scale 0-10  Pain Score Asleep  ECG Monitoring  PR interval 0.12  QRS interval 0.1  QT interval 0.38  QTc interval 0.46  CV Strip Heart Rate 75  Cardiac Rhythm NSR (ADMIT STRIP)  MEWS Score  MEWS Temp 0  MEWS Systolic 0  MEWS Pulse 0  MEWS RR 0  MEWS LOC 0  MEWS Score 0
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
## 2024-01-25 NOTE — ED Notes (Signed)
Transportation has been called.  
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
## 2024-01-25 NOTE — ED Notes (Signed)
Patient transported to CT/Xray. 

## 2024-01-25 NOTE — ED Notes (Signed)
[facility] here for transport to [facility].

## 2024-01-26 ENCOUNTER — Inpatient Hospital Stay (HOSPITAL_COMMUNITY): Payer: Self-pay

## 2024-01-26 LAB — BASIC METABOLIC PANEL
Anion gap: 7 (ref 5–15)
BUN: 10 mg/dL (ref 6–20)
CO2: 25 mmol/L (ref 22–32)
Calcium: 8.6 mg/dL — ABNORMAL LOW (ref 8.9–10.3)
Chloride: 104 mmol/L (ref 98–111)
Creatinine, Ser: 0.86 mg/dL (ref 0.44–1.00)
GFR, Estimated: >60 mL/min (ref >60)
Glucose, Bld: 106 mg/dL — ABNORMAL HIGH (ref 70–99)
Potassium: 3.7 mmol/L (ref 3.5–5.1)
Sodium: 136 mmol/L (ref 135–145)

## 2024-01-26 LAB — HEPATIC FUNCTION PANEL
ALT: 76 U/L — ABNORMAL HIGH (ref 0–44)
AST: 66 U/L — ABNORMAL HIGH (ref 15–41)
Albumin: 3.7 g/dL (ref 3.5–5.0)
Alkaline Phosphatase: 45 U/L (ref 38–126)
Bilirubin, Direct: 0.2 mg/dL (ref 0.0–0.2)
Indirect Bilirubin: 0.8 mg/dL (ref 0.3–0.9)
Total Bilirubin: 1 mg/dL (ref 0.0–1.2)
Total Protein: 6.3 g/dL — ABNORMAL LOW (ref 6.5–8.1)

## 2024-01-26 LAB — CBC
HCT: 38 % (ref 36.0–46.0)
Hemoglobin: 12.4 g/dL (ref 12.0–15.0)
MCH: 28.9 pg (ref 26.0–34.0)
MCHC: 32.6 g/dL (ref 30.0–36.0)
MCV: 88.6 fL (ref 80.0–100.0)
Platelets: 195 10*3/uL (ref 150–400)
RBC: 4.29 MIL/uL (ref 3.87–5.11)
RDW: 13.5 % (ref 11.5–15.5)
WBC: 5.2 10*3/uL (ref 4.0–10.5)
nRBC: 0 % (ref 0.0–0.2)

## 2024-01-26 LAB — SURGICAL PCR SCREEN
MRSA, PCR: NEGATIVE
Staphylococcus aureus: POSITIVE — AB

## 2024-01-26 MED ORDER — HYDROMORPHONE HCL 1 MG/ML IJ SOLN
0.5000 mg | INTRAMUSCULAR | Status: DC | PRN
  Administered 2024-01-26 – 2024-01-28 (×7): 0.5 mg via INTRAVENOUS
  Filled 2024-01-26 (×8): qty 0.5

## 2024-01-26 MED ORDER — CHLORHEXIDINE GLUCONATE 4 % EX SOLN
60.0000 mL | Freq: Once | CUTANEOUS | Status: DC
  Filled 2024-01-26: qty 60

## 2024-01-26 MED ORDER — GABAPENTIN 100 MG PO CAPS: 100.0000 mg | ORAL_CAPSULE | Freq: Three times a day (TID) | ORAL | Status: DC | PRN

## 2024-01-26 MED ORDER — METHOCARBAMOL 500 MG PO TABS
500.0000 mg | ORAL_TABLET | Freq: Three times a day (TID) | ORAL | Status: DC
  Administered 2024-01-26 – 2024-01-28 (×6): 500 mg via ORAL
  Filled 2024-01-26 (×8): qty 1

## 2024-01-26 MED ORDER — POVIDONE-IODINE 10 % EX SWAB
2.0000 | Freq: Once | CUTANEOUS | Status: AC
  Administered 2024-01-27: 2 via TOPICAL

## 2024-01-26 MED ORDER — ENOXAPARIN SODIUM 30 MG/0.3ML IJ SOSY
30.0000 mg | PREFILLED_SYRINGE | Freq: Two times a day (BID) | INTRAMUSCULAR | Status: DC
  Administered 2024-01-26 – 2024-01-27 (×3): 30 mg via SUBCUTANEOUS
  Filled 2024-01-26 (×5): qty 0.3

## 2024-01-26 MED ORDER — CEFAZOLIN SODIUM-DEXTROSE 2-4 GM/100ML-% IV SOLN
2.0000 g | INTRAVENOUS | Status: AC
  Administered 2024-01-27: 2 g via INTRAVENOUS
  Filled 2024-01-26: qty 100
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
## 2024-01-27 ENCOUNTER — Inpatient Hospital Stay (HOSPITAL_COMMUNITY): Payer: MEDICAID | Admitting: Anesthesiology

## 2024-01-27 ENCOUNTER — Inpatient Hospital Stay (HOSPITAL_COMMUNITY): Payer: Self-pay | Admitting: Anesthesiology

## 2024-01-27 ENCOUNTER — Other Ambulatory Visit: Payer: Self-pay

## 2024-01-27 ENCOUNTER — Encounter (HOSPITAL_COMMUNITY): Admission: EM | Disposition: A | Discharge status: Home / Self Care | Payer: Self-pay | Source: Home / Self Care

## 2024-01-27 ENCOUNTER — Encounter (HOSPITAL_COMMUNITY): Payer: Self-pay | Admitting: Surgery

## 2024-01-27 ENCOUNTER — Inpatient Hospital Stay (HOSPITAL_COMMUNITY): Payer: Self-pay

## 2024-01-27 DIAGNOSIS — S42001A Fracture of unspecified part of right clavicle, initial encounter for closed fracture: Secondary | ICD-10-CM

## 2024-01-27 HISTORY — PX: ORIF CLAVICULAR FRACTURE: SHX5055

## 2024-01-27 LAB — BASIC METABOLIC PANEL
Anion gap: 9 (ref 5–15)
BUN: 11 mg/dL (ref 6–20)
CO2: 25 mmol/L (ref 22–32)
Calcium: 8.6 mg/dL — ABNORMAL LOW (ref 8.9–10.3)
Chloride: 104 mmol/L (ref 98–111)
Creatinine, Ser: 0.79 mg/dL (ref 0.44–1.00)
GFR, Estimated: >60 mL/min (ref >60)
Glucose, Bld: 87 mg/dL (ref 70–99)
Potassium: 4.2 mmol/L (ref 3.5–5.1)
Sodium: 138 mmol/L (ref 135–145)

## 2024-01-27 LAB — CBC
HCT: 34.6 % — ABNORMAL LOW (ref 36.0–46.0)
Hemoglobin: 11.5 g/dL — ABNORMAL LOW (ref 12.0–15.0)
MCH: 29.8 pg (ref 26.0–34.0)
MCHC: 33.2 g/dL (ref 30.0–36.0)
MCV: 89.6 fL (ref 80.0–100.0)
Platelets: 179 10*3/uL (ref 150–400)
RBC: 3.86 MIL/uL — ABNORMAL LOW (ref 3.87–5.11)
RDW: 13.3 % (ref 11.5–15.5)
WBC: 3.6 10*3/uL — ABNORMAL LOW (ref 4.0–10.5)
nRBC: 0 % (ref 0.0–0.2)

## 2024-01-27 LAB — HEPATIC FUNCTION PANEL
ALT: 46 U/L — ABNORMAL HIGH (ref 0–44)
AST: 46 U/L — ABNORMAL HIGH (ref 15–41)
Albumin: 3.1 g/dL — ABNORMAL LOW (ref 3.5–5.0)
Alkaline Phosphatase: 36 U/L — ABNORMAL LOW (ref 38–126)
Bilirubin, Direct: 0.1 mg/dL (ref 0.0–0.2)
Indirect Bilirubin: 0.3 mg/dL (ref 0.3–0.9)
Total Bilirubin: 0.4 mg/dL (ref 0.0–1.2)
Total Protein: 5.3 g/dL — ABNORMAL LOW (ref 6.5–8.1)

## 2024-01-27 SURGERY — OPEN REDUCTION INTERNAL FIXATION (ORIF) CLAVICULAR FRACTURE
Anesthesia: General | Laterality: Right

## 2024-01-27 MED ORDER — PHENOL 1.4 % MT LIQD: 1.0000 | OROMUCOSAL | Status: DC | PRN

## 2024-01-27 MED ORDER — BUPIVACAINE-EPINEPHRINE 0.5% -1:200000 IJ SOLN
INTRAMUSCULAR | Status: DC | PRN
  Administered 2024-01-27: 30 mL

## 2024-01-27 MED ORDER — CHLORHEXIDINE GLUCONATE 0.12 % MT SOLN: 15.0000 mL | Freq: Once | OROMUCOSAL | Status: AC

## 2024-01-27 MED ORDER — DEXAMETHASONE SODIUM PHOSPHATE 10 MG/ML IJ SOLN
INTRAMUSCULAR | Status: DC | PRN
Start: 2024-01-27 — End: 2024-01-27
  Administered 2024-01-27: 10 mg via INTRAVENOUS

## 2024-01-27 MED ORDER — HYDROMORPHONE HCL 1 MG/ML IJ SOLN
INTRAMUSCULAR | Status: AC
  Filled 2024-01-27: qty 0.5

## 2024-01-27 MED ORDER — BUPIVACAINE-EPINEPHRINE (PF) 0.5% -1:200000 IJ SOLN
INTRAMUSCULAR | Status: AC
  Filled 2024-01-27: qty 30

## 2024-01-27 MED ORDER — CHLORHEXIDINE GLUCONATE 0.12 % MT SOLN
OROMUCOSAL | Status: AC
  Administered 2024-01-27: 15 mL via OROMUCOSAL
  Filled 2024-01-27: qty 15

## 2024-01-27 MED ORDER — ONDANSETRON HCL 4 MG/2ML IJ SOLN
INTRAMUSCULAR | Status: DC | PRN
  Administered 2024-01-27: 4 mg via INTRAVENOUS

## 2024-01-27 MED ORDER — CEFAZOLIN SODIUM-DEXTROSE 2-4 GM/100ML-% IV SOLN
2.0000 g | Freq: Four times a day (QID) | INTRAVENOUS | Status: AC
  Administered 2024-01-27 (×2): 2 g via INTRAVENOUS
  Filled 2024-01-27 (×2): qty 100

## 2024-01-27 MED ORDER — ORAL CARE MOUTH RINSE: 15.0000 mL | Freq: Once | OROMUCOSAL | Status: AC

## 2024-01-27 MED ORDER — LACTATED RINGERS IV SOLN: INTRAVENOUS | Status: DC

## 2024-01-27 MED ORDER — DEXMEDETOMIDINE HCL IN NACL 80 MCG/20ML IV SOLN
INTRAVENOUS | Status: DC | PRN
  Administered 2024-01-27 (×2): 4 ug via INTRAVENOUS

## 2024-01-27 MED ORDER — PHENYLEPHRINE HCL-NACL 20-0.9 MG/250ML-% IV SOLN
INTRAVENOUS | Status: DC | PRN
  Administered 2024-01-27: 35 ug/min via INTRAVENOUS

## 2024-01-27 MED ORDER — HYDROMORPHONE HCL 1 MG/ML IJ SOLN
INTRAMUSCULAR | Status: DC | PRN
  Administered 2024-01-27 (×2): .25 mg via INTRAVENOUS

## 2024-01-27 MED ORDER — DOCUSATE SODIUM 100 MG PO CAPS
100.0000 mg | ORAL_CAPSULE | Freq: Two times a day (BID) | ORAL | Status: DC
  Administered 2024-01-27 – 2024-01-28 (×2): 100 mg via ORAL
  Filled 2024-01-27 (×2): qty 1

## 2024-01-27 MED ORDER — CHLORHEXIDINE GLUCONATE CLOTH 2 % EX PADS: 6.0000 | MEDICATED_PAD | Freq: Every day | CUTANEOUS | Status: DC

## 2024-01-27 MED ORDER — FENTANYL CITRATE (PF) 250 MCG/5ML IJ SOLN
INTRAMUSCULAR | Status: DC | PRN
  Administered 2024-01-27 (×3): 50 ug via INTRAVENOUS

## 2024-01-27 MED ORDER — METOCLOPRAMIDE HCL 5 MG PO TABS: 5.0000 mg | ORAL_TABLET | Freq: Three times a day (TID) | ORAL | Status: DC | PRN

## 2024-01-27 MED ORDER — METOCLOPRAMIDE HCL 5 MG/ML IJ SOLN: 5.0000 mg | Freq: Three times a day (TID) | INTRAMUSCULAR | Status: DC | PRN

## 2024-01-27 MED ORDER — MUPIROCIN 2 % EX OINT
1.0000 | TOPICAL_OINTMENT | Freq: Two times a day (BID) | CUTANEOUS | Status: DC
  Administered 2024-01-27 – 2024-01-28 (×2): 1 via NASAL
  Filled 2024-01-27: qty 22

## 2024-01-27 MED ORDER — PROPOFOL 10 MG/ML IV BOLUS
INTRAVENOUS | Status: DC | PRN
  Administered 2024-01-27: 150 mg via INTRAVENOUS

## 2024-01-27 MED ORDER — LIDOCAINE 2% (20 MG/ML) 5 ML SYRINGE
INTRAMUSCULAR | Status: DC | PRN
  Administered 2024-01-27: 60 mg via INTRAVENOUS

## 2024-01-27 MED ORDER — MIDAZOLAM HCL 2 MG/2ML IJ SOLN
INTRAMUSCULAR | Status: AC
  Filled 2024-01-27: qty 2

## 2024-01-27 MED ORDER — MIDAZOLAM HCL 2 MG/2ML IJ SOLN
INTRAMUSCULAR | Status: DC | PRN
  Administered 2024-01-27: 2 mg via INTRAVENOUS

## 2024-01-27 MED ORDER — FENTANYL CITRATE (PF) 250 MCG/5ML IJ SOLN
INTRAMUSCULAR | Status: AC
  Filled 2024-01-27: qty 5

## 2024-01-27 MED ORDER — SUGAMMADEX SODIUM 200 MG/2ML IV SOLN
INTRAVENOUS | Status: DC | PRN
Start: 2024-01-27 — End: 2024-01-27
  Administered 2024-01-27: 200 mg via INTRAVENOUS

## 2024-01-27 MED ORDER — ACETAMINOPHEN 500 MG PO TABS
1000.0000 mg | ORAL_TABLET | Freq: Four times a day (QID) | ORAL | Status: DC
  Administered 2024-01-27 – 2024-01-28 (×4): 1000 mg via ORAL
  Filled 2024-01-27 (×4): qty 2

## 2024-01-27 MED ORDER — MENTHOL 3 MG MT LOZG: 1.0000 | LOZENGE | OROMUCOSAL | Status: DC | PRN

## 2024-01-27 MED ORDER — LIDOCAINE 5 % EX PTCH
1.0000 | MEDICATED_PATCH | CUTANEOUS | Status: DC
  Administered 2024-01-27 – 2024-01-28 (×2): 1 via TRANSDERMAL
  Filled 2024-01-27 (×2): qty 1

## 2024-01-27 MED ORDER — 0.9 % SODIUM CHLORIDE (POUR BTL) OPTIME
TOPICAL | Status: DC | PRN
  Administered 2024-01-27: 1000 mL

## 2024-01-27 MED ORDER — ROCURONIUM BROMIDE 10 MG/ML (PF) SYRINGE
PREFILLED_SYRINGE | INTRAVENOUS | Status: DC | PRN
  Administered 2024-01-27: 50 mg via INTRAVENOUS

## 2024-01-27 MED ORDER — PROPOFOL 10 MG/ML IV BOLUS
INTRAVENOUS | Status: AC
  Filled 2024-01-27: qty 20

## 2024-01-27 SURGICAL SUPPLY — 47 items
BAG COUNTER SPONGE SURGICOUNT (BAG) ×1 IMPLANT
BIT DRILL 2.3 QUICK RELEASE (BIT) IMPLANT
BRUSH SCRUB EZ PLAIN DRY (MISCELLANEOUS) ×2 IMPLANT
COVER SURGICAL LIGHT HANDLE (MISCELLANEOUS) ×2 IMPLANT
DRAPE C-ARM 42X72 X-RAY (DRAPES) ×1 IMPLANT
DRAPE INCISE IOBAN 66X45 STRL (DRAPES) ×1 IMPLANT
DRAPE LAPAROSCOPIC ABDOMINAL (DRAPES) IMPLANT
DRAPE LAPAROTOMY TRNSV 102X78 (DRAPES) ×1 IMPLANT
DRAPE U-SHAPE 47X51 STRL (DRAPES) ×2 IMPLANT
DRILL 2.3 QUICK RELEASE (BIT) ×1 IMPLANT
DRSG MEPILEX POST OP 4X8 (GAUZE/BANDAGES/DRESSINGS) IMPLANT
ELECT REM PT RETURN 9FT ADLT (ELECTROSURGICAL) ×1 IMPLANT
ELECTRODE REM PT RTRN 9FT ADLT (ELECTROSURGICAL) ×1 IMPLANT
GLOVE BIO SURGEON STRL SZ7.5 (GLOVE) ×1 IMPLANT
GLOVE BIO SURGEON STRL SZ8 (GLOVE) ×1 IMPLANT
GLOVE BIOGEL PI IND STRL 7.5 (GLOVE) ×1 IMPLANT
GLOVE BIOGEL PI IND STRL 8 (GLOVE) ×1 IMPLANT
GLOVE SURG ORTHO LTX SZ7.5 (GLOVE) ×2 IMPLANT
GOWN STRL REUS W/ TWL LRG LVL3 (GOWN DISPOSABLE) ×2 IMPLANT
GOWN STRL REUS W/ TWL XL LVL3 (GOWN DISPOSABLE) ×1 IMPLANT
KIT BASIN OR (CUSTOM PROCEDURE TRAY) ×1 IMPLANT
KIT TURNOVER KIT B (KITS) ×1 IMPLANT
NDL HYPO 25GX1X1/2 BEV (NEEDLE) IMPLANT
NEEDLE HYPO 25GX1X1/2 BEV (NEEDLE) ×1 IMPLANT
NS IRRIG 1000ML POUR BTL (IV SOLUTION) ×1 IMPLANT
PACK GENERAL/GYN (CUSTOM PROCEDURE TRAY) ×1 IMPLANT
PAD ARMBOARD POSITIONER FOAM (MISCELLANEOUS) ×2 IMPLANT
PLATE CLAVICLE 8 HOLE (Plate) IMPLANT
SCREW BONE LOCK 3.0X10MM HEXA (Screw) IMPLANT
SCREW BONE LOCK 3.0X8MM HEXA (Screw) IMPLANT
SCREW HEX LOCK 3.0X14MM (Screw) IMPLANT
SCREW HEX LOCK 3.0X16MM (Screw) IMPLANT
SCREW NON LOCK 3.0X10 (Screw) IMPLANT
SCREW NON LOCK 3.0X14 (Screw) IMPLANT
SLING ARM FOAM STRAP MED (SOFTGOODS) IMPLANT
STAPLER VISISTAT 35W (STAPLE) ×1 IMPLANT
STRIP CLOSURE SKIN 1/2X4 (GAUZE/BANDAGES/DRESSINGS) IMPLANT
SUCTION TUBE FRAZIER 10FR DISP (SUCTIONS) ×1 IMPLANT
SUT MNCRL AB 3-0 PS2 27 (SUTURE) ×1 IMPLANT
SUT MNCRL AB 4-0 PS2 18 (SUTURE) IMPLANT
SUT MON AB 2-0 CT1 36 (SUTURE) IMPLANT
SUT VIC AB 0 CT1 27XBRD ANBCTR (SUTURE) ×1 IMPLANT
SUT VIC AB 2-0 CT1 TAPERPNT 27 (SUTURE) ×1 IMPLANT
SYR CONTROL 10ML LL (SYRINGE) IMPLANT
TOWEL GREEN STERILE (TOWEL DISPOSABLE) ×1 IMPLANT
TOWEL GREEN STERILE FF (TOWEL DISPOSABLE) ×2 IMPLANT
WATER STERILE IRR 1000ML POUR (IV SOLUTION) ×1 IMPLANT

## 2024-01-27 NOTE — Progress Notes (Signed)
 OT Cancellation Note  Patient Details Name: Lauren Lawson MRN: [id number] DOB: 1995-11-26   Cancelled Treatment:    Reason Eval/Treat Not Completed: Patient at procedure or test/ unavailable  Mateo Flow 01/27/2024, 8:24 AM
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
## 2024-01-27 NOTE — Progress Notes (Signed)
 Verbal report given to nurse at Short Stay pt going for surgical procedure .

## 2024-01-27 NOTE — Progress Notes (Signed)
 No changes overnight.  The risks and benefits of right clavicle repair were discussed with the patient and her family, including the possibility of infection, nerve injury, vessel injury, wound breakdown, arthritis, symptomatic hardware, DVT/ PE, loss of motion, malunion, nonunion, and need for further surgery among others.  We also specifically discussed infraclavicular numbness and possible subsequent plate removal.  These risks were acknowledged and consent was provided to proceed.  Myrene Galas, MD [facility], [facility] [phone]

## 2024-01-27 NOTE — Transfer of Care (Signed)
 Immediate Anesthesia Transfer of Care Note  Patient: Lauren Lawson  Procedure(s) Performed: OPEN REDUCTION INTERNAL FIXATION (ORIF) CLAVICULAR FRACTURE (Right)  Patient Location: PACU  Anesthesia Type:General  Level of Consciousness: awake, alert , and oriented  Airway & Oxygen Therapy: Patient Spontanous Breathing  Post-op Assessment: Report given to RN  Post vital signs: Reviewed and stable  Last Vitals:  Vitals Value Taken Time  BP 130/80 01/27/24 1130  Temp 36.4 C 01/27/24 1130  Pulse 93 01/27/24 1134  Resp 16 01/27/24 1134  SpO2 96 % 01/27/24 1134  Vitals shown include unfiled device data.  Last Pain:  Vitals:   01/27/24 0801  TempSrc:   PainSc: 7       Patients Stated Pain Goal: 2 (01/26/24 1129)  Complications: No notable events documented.
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
## 2024-01-27 NOTE — Progress Notes (Signed)
 Transition of Care [facility]) - CAGE-AID Screening   Patient Details  Name: Lauren Lawson MRN: [id number] Date of Birth: 15-Feb-1996  Transition of Care [facility]) CM/SW Contact:    Judie Bonus, RN Phone Number: 01/27/2024, 6:26 AM   Clinical Narrative:  Pt reports occasional etoh, denies tobacco or elicit substances.  Pt reports vaping daily.  CAGE-AID Screening:    Have You Ever Felt You Ought to Cut Down on Your Drinking or Drug Use?: No Have People Annoyed You By Critizing Your Drinking Or Drug Use?: No Have You Felt Bad Or Guilty About Your Drinking Or Drug Use?: No Have You Ever Had a Drink or Used Drugs First Thing In The Morning to Steady Your Nerves or to Get Rid of a Hangover?: No CAGE-AID Score: 0  Substance Abuse Education Offered: No

## 2024-01-27 NOTE — Anesthesia Preprocedure Evaluation (Signed)
 Anesthesia Evaluation  Patient identified by MRN, date of birth, ID band Patient awake    Reviewed: Allergy & Precautions, NPO status , Patient's Chart, lab work & pertinent test results  Airway Mallampati: II  TM Distance: >3 FB Neck ROM: Full    Dental  (+) Dental Advisory Given   Pulmonary Current Smoker and Patient abstained from smoking.   breath sounds clear to auscultation       Cardiovascular negative cardio ROS  Rhythm:Regular Rate:Normal     Neuro/Psych negative neurological ROS     GI/Hepatic negative GI ROS, Neg liver ROS,,,  Endo/Other  negative endocrine ROS    Renal/GU negative Renal ROS     Musculoskeletal   Abdominal   Peds  Hematology negative hematology ROS (+)   Anesthesia Other Findings   Reproductive/Obstetrics                             Anesthesia Physical Anesthesia Plan  ASA: 2  Anesthesia Plan: General   Post-op Pain Management: Tylenol PO (pre-op)* and Toradol IV (intra-op)*   Induction: Intravenous  PONV Risk Score and Plan: 2 and Dexamethasone, Ondansetron, Midazolam and Treatment may vary due to age or medical condition  Airway Management Planned: Oral ETT  Additional Equipment: None  Intra-op Plan:   Post-operative Plan: Extubation in OR  Informed Consent: I have reviewed the patients History and Physical, chart, labs and discussed the procedure including the risks, benefits and alternatives for the proposed anesthesia with the patient or authorized representative who has indicated his/her understanding and acceptance.     Dental advisory given  Plan Discussed with: CRNA  Anesthesia Plan Comments:        Anesthesia Quick Evaluation

## 2024-01-27 NOTE — Plan of Care (Signed)

## 2024-01-28 ENCOUNTER — Encounter (HOSPITAL_COMMUNITY): Payer: Self-pay | Admitting: Orthopedic Surgery

## 2024-01-28 ENCOUNTER — Inpatient Hospital Stay (HOSPITAL_COMMUNITY): Payer: MEDICAID

## 2024-01-28 ENCOUNTER — Other Ambulatory Visit (HOSPITAL_COMMUNITY): Payer: Self-pay

## 2024-01-28 LAB — BASIC METABOLIC PANEL
Anion gap: 4 — ABNORMAL LOW (ref 5–15)
BUN: 15 mg/dL (ref 6–20)
CO2: 29 mmol/L (ref 22–32)
Calcium: 8.5 mg/dL — ABNORMAL LOW (ref 8.9–10.3)
Chloride: 105 mmol/L (ref 98–111)
Creatinine, Ser: 0.81 mg/dL (ref 0.44–1.00)
GFR, Estimated: >60 mL/min (ref >60)
Glucose, Bld: 105 mg/dL — ABNORMAL HIGH (ref 70–99)
Potassium: 3.8 mmol/L (ref 3.5–5.1)
Sodium: 138 mmol/L (ref 135–145)

## 2024-01-28 LAB — CBC
HCT: 35.8 % — ABNORMAL LOW (ref 36.0–46.0)
Hemoglobin: 11.8 g/dL — ABNORMAL LOW (ref 12.0–15.0)
MCH: 29.2 pg (ref 26.0–34.0)
MCHC: 33 g/dL (ref 30.0–36.0)
MCV: 88.6 fL (ref 80.0–100.0)
Platelets: 232 10*3/uL (ref 150–400)
RBC: 4.04 MIL/uL (ref 3.87–5.11)
RDW: 13.2 % (ref 11.5–15.5)
WBC: 6.2 10*3/uL (ref 4.0–10.5)
nRBC: 0 % (ref 0.0–0.2)

## 2024-01-28 MED ORDER — TRAMADOL HCL 50 MG PO TABS
50.0000 mg | ORAL_TABLET | Freq: Four times a day (QID) | ORAL | Status: DC
  Administered 2024-01-28 (×2): 50 mg via ORAL
  Filled 2024-01-28 (×2): qty 1

## 2024-01-28 MED ORDER — LIDOCAINE 5 % EX PTCH
1.0000 | MEDICATED_PATCH | CUTANEOUS | 0 refills | Status: AC
  Filled 2024-01-28: qty 7, 7d supply, fill #0

## 2024-01-28 MED ORDER — OXYCODONE HCL 5 MG PO TABS
5.0000 mg | ORAL_TABLET | ORAL | 0 refills | Status: AC | PRN
  Filled 2024-01-28: qty 30, 5d supply, fill #0

## 2024-01-28 MED ORDER — METHOCARBAMOL 500 MG PO TABS
500.0000 mg | ORAL_TABLET | Freq: Four times a day (QID) | ORAL | 0 refills | Status: AC | PRN
  Filled 2024-01-28: qty 20, 5d supply, fill #0

## 2024-01-28 MED ORDER — GABAPENTIN 100 MG PO CAPS
200.0000 mg | ORAL_CAPSULE | Freq: Three times a day (TID) | ORAL | 0 refills | Status: AC | PRN
  Filled 2024-01-28: qty 9, 2d supply, fill #0

## 2024-01-28 MED ORDER — DOCUSATE SODIUM 100 MG PO CAPS: 100.0000 mg | ORAL_CAPSULE | Freq: Two times a day (BID) | ORAL | Status: AC | PRN

## 2024-01-28 MED ORDER — GABAPENTIN 100 MG PO CAPS: 200.0000 mg | ORAL_CAPSULE | Freq: Three times a day (TID) | ORAL | Status: DC | PRN

## 2024-01-28 NOTE — TOC Transition Note (Signed)
 Transition of Care [facility]) - Discharge Note   Patient Details  Name: Londa Mackowski MRN: [id number] Date of Birth: 17-Nov-1995  Transition of Care [facility]) CM/SW Contact:  Glennon Mac, RN Phone Number: 01/28/2024, 4:28 PM   Clinical Narrative:    Patient medically stable for discharge home today with family/friends to provide needed assistance.  PT recommending no OP follow up; ortho tech has delivered crutches for home. No discharge needs identified.     Final next level of care: Home/Self Care Barriers to Discharge: Barriers Resolved                       Discharge Plan and Services Additional resources added to the After Visit Summary for  na   Discharge Planning Services: CM Consult            DME Arranged: Crutches       Representative spoke with at DME Agency: Ortho tech            Social Drivers of Health (SDOH) Interventions SDOH Screenings   Food Insecurity: No Food Insecurity (01/26/2024)  Housing: Low Risk  (01/26/2024)  Transportation Needs: No Transportation Needs (01/26/2024)  Utilities: Not At Risk (01/26/2024)  Tobacco Use: High Risk (01/27/2024)     Readmission Risk Interventions     No data to display         Quintella Baton, RN, BSN  Trauma/Neuro ICU Case Manager [phone]

## 2024-01-28 NOTE — Progress Notes (Signed)
 IV removed from left forearm without incident. Medications retrieved from [facility] pharmacy. Patient given discharge instructions and education prior to discharge. Patient transported by wheelchair to entrance A of campus. Patient transferred to passenger seat of sister's personal vehicle to be transported home. All patient's belongings accounted for and delivered to sister's personal vehicle prior to them leaving.

## 2024-01-28 NOTE — Anesthesia Postprocedure Evaluation (Signed)
 Anesthesia Post Note  Patient: Lauren Lawson  Procedure(s) Performed: OPEN REDUCTION INTERNAL FIXATION (ORIF) CLAVICULAR FRACTURE (Right)     Patient location during evaluation: PACU Anesthesia Type: General Level of consciousness: awake and alert Pain management: pain level controlled Vital Signs Assessment: post-procedure vital signs reviewed and stable Respiratory status: spontaneous breathing, nonlabored ventilation, respiratory function stable and patient connected to nasal cannula oxygen Cardiovascular status: blood pressure returned to baseline and stable Postop Assessment: no apparent nausea or vomiting Anesthetic complications: no   No notable events documented.  Last Vitals:  Vitals:   01/27/24 2312 01/28/24 0825  BP: 115/77 112/70  Pulse: 60 (!) 59  Resp: 15 13  Temp: 36.6 C 37 C  SpO2: 96% 96%    Last Pain:  Vitals:   01/28/24 0851  TempSrc:   PainSc: 8    Pain Goal: Patients Stated Pain Goal: 2 (01/26/24 1129)                 Kennieth Rad
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
## 2024-01-28 NOTE — Plan of Care (Signed)

## 2024-01-28 NOTE — Progress Notes (Signed)
 Orthopedic Tech Progress Note Patient Details:  Lauren Lawson Nov 16, 1995 [id number]  Ortho Devices Type of Ortho Device: Crutches Ortho Device/Splint Interventions: Ordered, Application, Adjustment   Post Interventions Patient Tolerated: Well, Ambulated well Instructions Provided: Care of device  Donald Pore 01/28/2024, 3:46 PM

## 2024-01-28 NOTE — Plan of Care (Signed)
   Problem: Education: Goal: Knowledge of General Education information will improve Description: Including pain rating scale, medication(s)/side effects and non-pharmacologic comfort measures Outcome: Progressing   Problem: Health Behavior/Discharge Planning: Goal: Ability to manage health-related needs will improve Outcome: Progressing   Problem: Clinical Measurements: Goal: Ability to maintain clinical measurements within normal limits will improve Outcome: Progressing Goal: Will remain free from infection Outcome: Progressing Goal: Diagnostic test results will improve Outcome: Progressing Goal: Respiratory complications will improve Outcome: Progressing Goal: Cardiovascular complication will be avoided Outcome: Progressing   Problem: Activity: Goal: Risk for activity intolerance will decrease Outcome: Progressing   Problem: Nutrition: Goal: Adequate nutrition will be maintained Outcome: Progressing   Problem: Coping: Goal: Level of anxiety will decrease Outcome: Progressing   Problem: Elimination: Goal: Will not experience complications related to bowel motility Outcome: Progressing Goal: Will not experience complications related to urinary retention Outcome: Progressing   Problem: Pain Managment: Goal: General experience of comfort will improve and/or be controlled Outcome: Progressing   Problem: Safety: Goal: Ability to remain free from injury will improve Outcome: Progressing   Problem: Skin Integrity: Goal: Risk for impaired skin integrity will decrease Outcome: Progressing   Problem: Education: Goal: Knowledge of the prescribed therapeutic regimen will improve Outcome: Progressing Goal: Understanding of activity limitations/precautions following surgery will improve Outcome: Progressing Goal: Individualized Educational Video(s) Outcome: Progressing   Problem: Activity: Goal: Ability to tolerate increased activity will improve Outcome: Progressing    Problem: Pain Management: Goal: Pain level will decrease with appropriate interventions Outcome: Progressing
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
## 2024-07-01 ENCOUNTER — Ambulatory Visit
Admission: EM | Admit: 2024-07-01 | Discharge: 2024-07-01 | Disposition: A | Discharge status: Home / Self Care | Attending: Emergency Medicine | Admitting: Emergency Medicine

## 2024-07-01 ENCOUNTER — Encounter: Payer: Self-pay | Admitting: Emergency Medicine

## 2024-07-01 DIAGNOSIS — R319 Hematuria, unspecified: Secondary | ICD-10-CM | POA: Insufficient documentation

## 2024-07-01 DIAGNOSIS — N10 Acute tubulo-interstitial nephritis: Secondary | ICD-10-CM | POA: Insufficient documentation

## 2024-07-01 LAB — POCT URINALYSIS DIP (MANUAL ENTRY)
Glucose, UA: NEGATIVE mg/dL
Nitrite, UA: NEGATIVE
Protein Ur, POC: >=300 mg/dL — AB
Spec Grav, UA: >=1.03 — AB (ref 1.010–1.025)
Urobilinogen, UA: NEGATIVE U/dL — AB
pH, UA: 6 (ref 5.0–8.0)

## 2024-07-01 LAB — POCT URINE PREGNANCY: Preg Test, Ur: NEGATIVE

## 2024-07-01 MED ORDER — KETOROLAC TROMETHAMINE 30 MG/ML IJ SOLN
30.0000 mg | Freq: Once | INTRAMUSCULAR | Status: AC
  Administered 2024-07-01: 30 mg via INTRAMUSCULAR

## 2024-07-01 MED ORDER — FLUCONAZOLE 150 MG PO TABS: 150.0000 mg | ORAL_TABLET | Freq: Every day | ORAL | 0 refills | Status: DC

## 2024-07-01 MED ORDER — CIPROFLOXACIN HCL 500 MG PO TABS
500.0000 mg | ORAL_TABLET | Freq: Two times a day (BID) | ORAL | 0 refills | Status: AC
Start: 2024-07-01 — End: 2024-07-06

## 2024-07-01 MED ORDER — CEFTRIAXONE SODIUM 1 G IJ SOLR
1.0000 g | Freq: Once | INTRAMUSCULAR | Status: AC
  Administered 2024-07-01: 1 g via INTRAMUSCULAR

## 2024-07-01 NOTE — Discharge Instructions (Signed)
 We have given you an injection in clinic for pain and an injection of antibiotics here in clinic.  For any breakthrough pain throughout the rest of today you can take Tylenol .  Starting tomorrow you can take 800 mg of ibuprofen  every 8 hours.  Ensure you are drinking at least 64 ounces of water daily to help flush the kidneys.  Take the Cipro  twice daily for the next 5 days to help treat your urinary tract infection.  You can continue to use Azo over-the-counter, this will change the color of your urine.  Symptoms should improve over the next few days with antibiotics.  If you develop severe pain, fever, vomiting, or new concerning symptoms seek immediate care at the nearest emergency department.

## 2024-07-01 NOTE — ED Triage Notes (Signed)
 Patient c/o hematuria, lower abdominal pain, pressure to urinate since yesterday.  Some back pain yesterday.  Patient has tried AZO, Ibuprofen , Tylenol  and Amoxil  x 2 doses.  Afebrile.
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
## 2024-07-02 ENCOUNTER — Emergency Department (HOSPITAL_COMMUNITY)

## 2024-07-02 ENCOUNTER — Emergency Department (HOSPITAL_COMMUNITY)
Admission: EM | Admit: 2024-07-02 | Discharge: 2024-07-02 | Disposition: A | Discharge status: Home / Self Care | Attending: Emergency Medicine | Admitting: Emergency Medicine

## 2024-07-02 DIAGNOSIS — R1030 Lower abdominal pain, unspecified: Secondary | ICD-10-CM | POA: Diagnosis present

## 2024-07-02 DIAGNOSIS — N390 Urinary tract infection, site not specified: Secondary | ICD-10-CM | POA: Insufficient documentation

## 2024-07-02 LAB — URINALYSIS, W/ REFLEX TO CULTURE (INFECTION SUSPECTED)
Bilirubin Urine: NEGATIVE
Glucose, UA: NEGATIVE mg/dL
Ketones, ur: 15 mg/dL — AB
Nitrite: POSITIVE — AB
Protein, ur: >300 mg/dL — AB
RBC / HPF: >50 RBC/hpf (ref 0–5)
Specific Gravity, Urine: 1.025 (ref 1.005–1.030)
pH: 5 (ref 5.0–8.0)

## 2024-07-02 LAB — URINE CULTURE: Culture: NO GROWTH

## 2024-07-02 LAB — CBC WITH DIFFERENTIAL/PLATELET
Abs Immature Granulocytes: 0.04 K/uL (ref 0.00–0.07)
Basophils Absolute: 0 K/uL (ref 0.0–0.1)
Basophils Relative: 0 %
Eosinophils Absolute: 0.1 K/uL (ref 0.0–0.5)
Eosinophils Relative: 1 %
HCT: 40.5 % (ref 36.0–46.0)
Hemoglobin: 13 g/dL (ref 12.0–15.0)
Immature Granulocytes: 0 %
Lymphocytes Relative: 8 %
Lymphs Abs: 1 K/uL (ref 0.7–4.0)
MCH: 28.5 pg (ref 26.0–34.0)
MCHC: 32.1 g/dL (ref 30.0–36.0)
MCV: 88.8 fL (ref 80.0–100.0)
Monocytes Absolute: 0.5 K/uL (ref 0.1–1.0)
Monocytes Relative: 4 %
Neutro Abs: 10.7 K/uL — ABNORMAL HIGH (ref 1.7–7.7)
Neutrophils Relative %: 87 %
Platelets: 281 K/uL (ref 150–400)
RBC: 4.56 MIL/uL (ref 3.87–5.11)
RDW: 12.9 % (ref 11.5–15.5)
WBC: 12.3 K/uL — ABNORMAL HIGH (ref 4.0–10.5)
nRBC: 0 % (ref 0.0–0.2)

## 2024-07-02 LAB — COMPREHENSIVE METABOLIC PANEL WITH GFR
ALT: 12 U/L (ref 0–44)
AST: 17 U/L (ref 15–41)
Albumin: 4.5 g/dL (ref 3.5–5.0)
Alkaline Phosphatase: 48 U/L (ref 38–126)
Anion gap: 8 (ref 5–15)
BUN: 20 mg/dL (ref 6–20)
CO2: 24 mmol/L (ref 22–32)
Calcium: 9.4 mg/dL (ref 8.9–10.3)
Chloride: 106 mmol/L (ref 98–111)
Creatinine, Ser: 0.92 mg/dL (ref 0.44–1.00)
GFR, Estimated: >60 mL/min (ref >60)
Glucose, Bld: 130 mg/dL — ABNORMAL HIGH (ref 70–99)
Potassium: 3.6 mmol/L (ref 3.5–5.1)
Sodium: 138 mmol/L (ref 135–145)
Total Bilirubin: 0.7 mg/dL (ref 0.0–1.2)
Total Protein: 7.8 g/dL (ref 6.5–8.1)

## 2024-07-02 MED ORDER — PHENAZOPYRIDINE HCL 200 MG PO TABS
200.0000 mg | ORAL_TABLET | Freq: Three times a day (TID) | ORAL | Status: DC
  Administered 2024-07-02: 200 mg via ORAL
  Filled 2024-07-02: qty 1

## 2024-07-02 MED ORDER — ONDANSETRON HCL 4 MG/2ML IJ SOLN
4.0000 mg | Freq: Once | INTRAMUSCULAR | Status: AC
  Administered 2024-07-02: 4 mg via INTRAVENOUS
  Filled 2024-07-02: qty 2

## 2024-07-02 MED ORDER — OXYCODONE-ACETAMINOPHEN 5-325 MG PO TABS: 1.0000 | ORAL_TABLET | Freq: Four times a day (QID) | ORAL | 0 refills | Status: AC | PRN

## 2024-07-02 MED ORDER — KETOROLAC TROMETHAMINE 15 MG/ML IJ SOLN
15.0000 mg | Freq: Once | INTRAMUSCULAR | Status: AC
  Administered 2024-07-02: 15 mg via INTRAVENOUS
  Filled 2024-07-02: qty 1

## 2024-07-02 MED ORDER — HYDROMORPHONE HCL 1 MG/ML IJ SOLN
1.0000 mg | Freq: Once | INTRAMUSCULAR | Status: AC
  Administered 2024-07-02: 1 mg via INTRAVENOUS
  Filled 2024-07-02: qty 1

## 2024-07-02 NOTE — ED Provider Notes (Signed)
 [place] [facility] AT [facility] Provider Note   CSN: [id number] Arrival date & time: 07/02/24  0021     Patient presents with: Flank Pain   Lauren Lawson is a 28 y.o. female.   The history is provided by the patient and medical records.  Flank Pain  Lauren Lawson is a 28 y.o. female who presents to the Emergency Department complaining of abdominal pain. She presents the emergency department for evaluation of lower abdominal pain that started on Tuesday. He is a constant nonradiating pain. She also has urinary frequency that started around 9 PM. She has associated hematuria. She went to urgent care earlier the day and was treated with IM Rocephin , toradol  and prescribed ciprofloxacin  for UTI. No associated fever, nausea, vomiting. No known medical problems.        Prior to Admission medications   Medication Sig Start Date End Date Taking? Authorizing Provider  oxyCODONE -acetaminophen  (PERCOCET/ROXICET) 5-325 MG tablet Take 1 tablet by mouth every 6 (six) hours as needed for severe pain (pain score 7-10). 07/02/24  Yes Griselda Norris, MD  acetaminophen  (TYLENOL ) 325 MG tablet Take 650 mg by mouth every 6 (six) hours as needed for moderate pain (pain score 4-6).    [provider]  ciprofloxacin  (CIPRO ) 500 MG tablet Take 1 tablet (500 mg total) by mouth every 12 (twelve) hours for 5 days. 07/01/24 07/06/24  Dreama, [place]  N, FNP  docusate sodium  (COLACE) 100 MG capsule Take 1 capsule (100 mg total) by mouth 2 (two) times daily as needed for mild constipation. 01/28/24   Rosalba Glendale DEL, PA-C  fluconazole  (DIFLUCAN ) 150 MG tablet Take 1 tablet (150 mg total) by mouth daily. 07/01/24   Dreama, [place]  N, FNP  gabapentin  (NEURONTIN ) 100 MG capsule Take 2 capsules (200 mg total) by mouth 3 (three) times daily as needed for up to 3 days (pain). 01/28/24 01/31/24  Rosalba Glendale DEL, PA-C  ibuprofen  (ADVIL ,MOTRIN ) 600 MG tablet Take 1 tablet (600 mg total) by  mouth every 6 (six) hours. 11/15/17   Caleen Havens, MD    Allergies: Patient has no known allergies.    Review of Systems  Genitourinary:  Positive for flank pain.  All other systems reviewed and are negative.   Updated Vital Signs BP (!) 131/95   Pulse 74   Temp 98.2 F (36.8 C) (Oral)   Resp 19   LMP 06/07/2024 (Exact Date) Comment: negative urine pregnancy test 07/01/24  SpO2 100%   Physical Exam Vitals and nursing note reviewed.  Constitutional:      Appearance: She is well-developed.     Comments: Appears uncomfortable  HENT:     Head: Normocephalic and atraumatic.  Cardiovascular:     Rate and Rhythm: Normal rate and regular rhythm.  Pulmonary:     Effort: Pulmonary effort is normal. No respiratory distress.  Abdominal:     Palpations: Abdomen is soft.     Tenderness: There is no guarding or rebound.     Comments: Mild suprapubic tenderness  Musculoskeletal:        General: No tenderness.  Skin:    General: Skin is warm and dry.  Neurological:     Mental Status: She is alert and oriented to person, place, and time.  Psychiatric:        Behavior: Behavior normal.     (all labs ordered are listed, but only abnormal results are displayed) Labs Reviewed  COMPREHENSIVE METABOLIC PANEL WITH GFR - Abnormal; Notable for the following  components:      Result Value   Glucose, Bld 130 (*)    All other components within normal limits  CBC WITH DIFFERENTIAL/PLATELET - Abnormal; Notable for the following components:   WBC 12.3 (*)    Neutro Abs 10.7 (*)    All other components within normal limits  URINALYSIS, W/ REFLEX TO CULTURE (INFECTION SUSPECTED) - Abnormal; Notable for the following components:   Color, Urine RED (*)    APPearance TURBID (*)    Hgb urine dipstick LARGE (*)    Ketones, ur 15 (*)    Protein, ur >300 (*)    Nitrite POSITIVE (*)    Leukocytes,Ua SMALL (*)    Bacteria, UA MANY (*)    All other components within normal limits     EKG: None  Radiology: CT Renal Stone Study Result Date: 07/02/2024 CLINICAL DATA:  Flank pain nausea hematuria EXAM: CT ABDOMEN AND PELVIS WITHOUT CONTRAST TECHNIQUE: Multidetector CT imaging of the abdomen and pelvis was performed following the standard protocol without IV contrast. RADIATION DOSE REDUCTION: This exam was performed according to the departmental dose-optimization program which includes automated exposure control, adjustment of the mA and/or kV according to patient size and/or use of iterative reconstruction technique. COMPARISON:  CT 01/25/2024 FINDINGS: Lower chest: Lung bases are clear Hepatobiliary: No focal liver abnormality is seen. No gallstones, gallbladder wall thickening, or biliary dilatation. Pancreas: Unremarkable. No pancreatic ductal dilatation or surrounding inflammatory changes. Spleen: Upper normal in size at 12 cm Adrenals/Urinary Tract: Adrenal glands are normal. Small nonobstructing left kidney stone. No hydronephrosis. Thick-walled urinary bladder with marked perivesical stranding Stomach/Bowel: Stomach is within normal limits. Appendix appears normal. No evidence of bowel wall thickening, distention, or inflammatory changes. Vascular/Lymphatic: No significant vascular findings are present. No enlarged abdominal or pelvic lymph nodes. Reproductive: Uterus and bilateral adnexa are unremarkable. Other: Negative for pelvic effusion or free air Musculoskeletal: No acute or suspicious osseous abnormality IMPRESSION: 1. Thick-walled urinary bladder with marked perivesical stranding consistent with cystitis. 2. Small nonobstructing left kidney stone. Electronically Signed   By: Luke Bun M.D.   On: 07/02/2024 02:03     Procedures   Medications Ordered in the ED  phenazopyridine  (PYRIDIUM ) tablet 200 mg (200 mg Oral Given 07/02/24 0227)  HYDROmorphone  (DILAUDID ) injection 1 mg (1 mg Intravenous Given 07/02/24 0127)  ondansetron  (ZOFRAN ) injection 4 mg (4 mg  Intravenous Given 07/02/24 0127)  ketorolac  (TORADOL ) 15 MG/ML injection 15 mg (15 mg Intravenous Given 07/02/24 0228)                                    Medical Decision Making Amount and/or Complexity of Data Reviewed Labs: ordered. Radiology: ordered.  Risk Prescription drug management.   Patient here for evaluation of lower abdominal pain, urinary frequency dysuria. Patient is uncomfortable appearing on examination. UA is consistent with UTI. She has already been started on ciprofloxacin . She was treated with medications for pain control. She does have per diem available at home. CT scan demonstrates cystitis, no evidence of obstructing stone or appendicitis. Will send culture. Discussed home care for UTI. Discussed outpatient follow-up and return precautions.     Final diagnoses:  Acute UTI    ED Discharge Orders          Ordered    oxyCODONE -acetaminophen  (PERCOCET/ROXICET) 5-325 MG tablet  Every 6 hours PRN        07/02/24 0336  Griselda Norris, MD 07/02/24 [phone]

## 2024-07-02 NOTE — ED Triage Notes (Signed)
 Patient arrived with a known kidney infection, started antibiotics today, states 10/10 contraction like pain, nausea, and hematuria.

## 2024-07-04 ENCOUNTER — Telehealth: Payer: Self-pay | Admitting: Emergency Medicine

## 2024-07-04 MED ORDER — PHENAZOPYRIDINE HCL 200 MG PO TABS: 200.0000 mg | ORAL_TABLET | Freq: Three times a day (TID) | ORAL | 0 refills | Status: DC

## 2024-07-04 NOTE — Telephone Encounter (Signed)
 Pt called reporting hematuria, increasing pain, and non-improving symptoms. Reviewed urine culture and explained no growth in 3 days. And that provider recommend she finish current dose of antibiotics since CT + cystitis. Reported results of ct scan to her informed her she has a non-obstructive stone. Reported this could be the cause of pain and informed her to stay hydrated and return to the ED if she has difficulty urinating, worsening pain, fever nausea and or vomiting.  Pt requested work note. Note Faxed to provided email address.  Pt requested pain management advice reporting her pain medication she was given at the ED is gone. Recommended alternating between motrin , and tylenol  and spoke with Ozell NP who st we would send it Pyridium  to her pharmacy on file. Pt verbalized understanding.

## 2024-07-04 NOTE — Telephone Encounter (Signed)
 Patient called wanting a work note due to still having symptoms (hematuria, pain). She was seen on 07/01/24 then went to the ER on 07/02/24. Was given Cipro -has 4 pills left and Diflucan . At the ER was given Oxycodone  for pain. Feels she will not be able to work tomorrow 12 hr shift. Did advised patient that another provider prescribed medication for visit. Unsure if we can give a note or if she will need to be seen again.

## 2024-07-05 ENCOUNTER — Telehealth: Payer: Self-pay

## 2024-07-05 NOTE — Telephone Encounter (Signed)
 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 Stated I could not email but there was a note left in her MyChart from yesterday and provided her with the username and a pw reset link to access it. Pt verbalized understanding.

## 2024-07-09 ENCOUNTER — Encounter (HOSPITAL_BASED_OUTPATIENT_CLINIC_OR_DEPARTMENT_OTHER): Payer: Self-pay

## 2024-07-09 ENCOUNTER — Emergency Department (HOSPITAL_BASED_OUTPATIENT_CLINIC_OR_DEPARTMENT_OTHER)

## 2024-07-09 ENCOUNTER — Emergency Department (HOSPITAL_BASED_OUTPATIENT_CLINIC_OR_DEPARTMENT_OTHER)
Admission: EM | Admit: 2024-07-09 | Discharge: 2024-07-10 | Disposition: A | Discharge status: Home / Self Care | Attending: Emergency Medicine | Admitting: Emergency Medicine

## 2024-07-09 ENCOUNTER — Telehealth: Payer: Self-pay

## 2024-07-09 ENCOUNTER — Other Ambulatory Visit: Payer: Self-pay

## 2024-07-09 DIAGNOSIS — N3090 Cystitis, unspecified without hematuria: Secondary | ICD-10-CM | POA: Diagnosis not present

## 2024-07-09 DIAGNOSIS — R109 Unspecified abdominal pain: Secondary | ICD-10-CM | POA: Diagnosis present

## 2024-07-09 LAB — URINALYSIS, ROUTINE W REFLEX MICROSCOPIC
Bilirubin Urine: NEGATIVE
Glucose, UA: NEGATIVE mg/dL
Ketones, ur: 15 mg/dL — AB
Leukocytes,Ua: NEGATIVE
Nitrite: NEGATIVE
Specific Gravity, Urine: <1.005 — ABNORMAL LOW (ref 1.005–1.030)
pH: 5.5 (ref 5.0–8.0)

## 2024-07-09 LAB — CBC
HCT: 34.6 % — ABNORMAL LOW (ref 36.0–46.0)
Hemoglobin: 11.6 g/dL — ABNORMAL LOW (ref 12.0–15.0)
MCH: 28.8 pg (ref 26.0–34.0)
MCHC: 33.5 g/dL (ref 30.0–36.0)
MCV: 85.9 fL (ref 80.0–100.0)
Platelets: 158 K/uL (ref 150–400)
RBC: 4.03 MIL/uL (ref 3.87–5.11)
RDW: 12.5 % (ref 11.5–15.5)
WBC: 2.5 K/uL — ABNORMAL LOW (ref 4.0–10.5)
nRBC: 0 % (ref 0.0–0.2)

## 2024-07-09 LAB — COMPREHENSIVE METABOLIC PANEL WITH GFR
ALT: 39 U/L (ref 0–44)
AST: 43 U/L — ABNORMAL HIGH (ref 15–41)
Albumin: 4.4 g/dL (ref 3.5–5.0)
Alkaline Phosphatase: 55 U/L (ref 38–126)
Anion gap: 15 (ref 5–15)
BUN: 12 mg/dL (ref 6–20)
CO2: 21 mmol/L — ABNORMAL LOW (ref 22–32)
Calcium: 9.6 mg/dL (ref 8.9–10.3)
Chloride: 102 mmol/L (ref 98–111)
Creatinine, Ser: 0.78 mg/dL (ref 0.44–1.00)
GFR, Estimated: >60 mL/min (ref >60)
Glucose, Bld: 95 mg/dL (ref 70–99)
Potassium: 3.5 mmol/L (ref 3.5–5.1)
Sodium: 138 mmol/L (ref 135–145)
Total Bilirubin: 0.4 mg/dL (ref 0.0–1.2)
Total Protein: 6.8 g/dL (ref 6.5–8.1)

## 2024-07-09 LAB — PREGNANCY, URINE: Preg Test, Ur: NEGATIVE

## 2024-07-09 MED ORDER — SODIUM CHLORIDE 0.9 % IV BOLUS
1000.0000 mL | Freq: Once | INTRAVENOUS | Status: AC
  Administered 2024-07-09: 1000 mL via INTRAVENOUS

## 2024-07-09 MED ORDER — IOHEXOL 300 MG/ML  SOLN
100.0000 mL | Freq: Once | INTRAMUSCULAR | Status: AC | PRN
  Administered 2024-07-09: 75 mL via INTRAVENOUS

## 2024-07-09 MED ORDER — KETOROLAC TROMETHAMINE 15 MG/ML IJ SOLN
15.0000 mg | Freq: Once | INTRAMUSCULAR | Status: AC
  Administered 2024-07-09: 15 mg via INTRAVENOUS
  Filled 2024-07-09: qty 1

## 2024-07-09 MED ORDER — ONDANSETRON HCL 4 MG/2ML IJ SOLN
4.0000 mg | Freq: Once | INTRAMUSCULAR | Status: AC
  Administered 2024-07-09: 4 mg via INTRAVENOUS
  Filled 2024-07-09: qty 2

## 2024-07-09 NOTE — Telephone Encounter (Signed)
 Dr. Pauline sts for pt to go to ER and have another WBC count checked and another urine culture. Pt notified.

## 2024-07-09 NOTE — ED Triage Notes (Signed)
 Pt c/o kidney issues since Tuesday, recently r/t kidney infection, UTI, kidney stones. Seen multiple times for same recently. Called PCP for same today, states compliance w abx w no relief. Also advises diarrhea x3-4wks

## 2024-07-09 NOTE — ED Notes (Signed)
 Patient transported to CT

## 2024-07-09 NOTE — ED Provider Notes (Signed)
 [facility] Emergency Department Provider Note MRN:  [id number]  Arrival date & time: 07/10/24     Chief Complaint   Abdominal Pain and Urinary Tract Infection   History of Present Illness   Lauren Lawson is a 28 y.o. year-old female with no pertinent past medical history presenting to the ED with chief complaint of abdominal pain.  Continued lower abdominal pain and dysuria for the past week.  Was diagnosed with UTI and has been taking ciprofloxacin , not helping.  Pain getting worse and worse.  Review of Systems  A thorough review of systems was obtained and all systems are negative except as noted in the HPI and PMH.   Patient's Health History    Past Medical History:  Diagnosis Date   Anxiety    HSV-2 (herpes simplex virus 2) infection    Manic bipolar I disorder (HCC)     Past Surgical History:  Procedure Laterality Date   ORIF CLAVICULAR FRACTURE Right 01/27/2024   Procedure: OPEN REDUCTION INTERNAL FIXATION (ORIF) CLAVICULAR FRACTURE;  Surgeon: Celena Sharper, MD;  Location: [facility] OR;  Service: Orthopedics;  Laterality: Right;    Family History  Problem Relation Age of Onset   Hypertension Father     Social History   Socioeconomic History   Marital status: Single    Spouse name: Not on file   Number of children: Not on file   Years of education: Not on file   Highest education level: Not on file  Occupational History   Not on file  Tobacco Use   Smoking status: Some Days    Types: Cigarettes   Smokeless tobacco: Never  Vaping Use   Vaping status: Never Used  Substance and Sexual Activity   Alcohol use: Yes    Comment: rare   Drug use: Yes    Types: Marijuana   Sexual activity: Yes    Birth control/protection: None  Other Topics Concern   Not on file  Social History Narrative   Not on file   Social Drivers of Health   Financial Resource Strain: Not on file  Food Insecurity: No Food Insecurity (01/26/2024)   Hunger Vital Sign     Worried About Running Out of Food in the Last Year: Never true    Ran Out of Food in the Last Year: Never true  Transportation Needs: No Transportation Needs (01/26/2024)   PRAPARE - Administrator, Civil Service (Medical): No    Lack of Transportation (Non-Medical): No  Physical Activity: Not on file  Stress: Not on file  Social Connections: Not on file  Intimate Partner Violence: Not At Risk (01/26/2024)   Humiliation, Afraid, Rape, and Kick questionnaire    Fear of Current or Ex-Partner: No    Emotionally Abused: No    Physically Abused: No    Sexually Abused: No     Physical Exam   Vitals:   07/09/24 2349 07/10/24 0112  BP: 130/89 123/84  Pulse: 68 71  Resp:  16  Temp:    SpO2: 100% 100%    CONSTITUTIONAL: Well-appearing, NAD NEURO/PSYCH:  Alert and oriented x 3, no focal deficits EYES:  eyes equal and reactive ENT/NECK:  no LAD, no JVD CARDIO: Regular rate, well-perfused, normal S1 and S2 PULM:  CTAB no wheezing or rhonchi GI/GU:  non-distended, non-tender MSK/SPINE:  No gross deformities, no edema SKIN:  no rash, atraumatic   *Additional and/or pertinent findings included in MDM below  Diagnostic and Interventional Summary  EKG Interpretation Date/Time:    Ventricular Rate:    PR Interval:    QRS Duration:    QT Interval:    QTC Calculation:   R Axis:      Text Interpretation:         Labs Reviewed  URINALYSIS, ROUTINE W REFLEX MICROSCOPIC - Abnormal; Notable for the following components:      Result Value   Color, Urine COLORLESS (*)    Specific Gravity, Urine <1.005 (*)    Hgb urine dipstick LARGE (*)    Ketones, ur 15 (*)    Protein, ur TRACE (*)    Bacteria, UA MANY (*)    All other components within normal limits  COMPREHENSIVE METABOLIC PANEL WITH GFR - Abnormal; Notable for the following components:   CO2 21 (*)    AST 43 (*)    All other components within normal limits  CBC - Abnormal; Notable for the following  components:   WBC 2.5 (*)    Hemoglobin 11.6 (*)    HCT 34.6 (*)    All other components within normal limits  WET PREP, GENITAL  URINE CULTURE  PREGNANCY, URINE  GC/CHLAMYDIA PROBE AMP ([place]) NOT AT [facility]    CT ABDOMEN PELVIS W CONTRAST  Final Result      Medications  cefTRIAXone  (ROCEPHIN ) 1 g in sodium chloride  0.9 % 100 mL IVPB (has no administration in time range)  sodium chloride  0.9 % bolus 1,000 mL (0 mLs Intravenous Stopped 07/10/24 0045)  ketorolac  (TORADOL ) 15 MG/ML injection 15 mg (15 mg Intravenous Given 07/09/24 2345)  ondansetron  (ZOFRAN ) injection 4 mg (4 mg Intravenous Given 07/09/24 2345)  iohexol  (OMNIPAQUE ) 300 MG/ML solution 100 mL (75 mLs Intravenous Contrast Given 07/09/24 2336)     Procedures  /  Critical Care Procedures  ED Course and Medical Decision Making  Initial Impression and Ddx Differential diagnosis includes UTI with resistant bacterial source, renal or bladder abscess, retained or infected kidney stone causing continued infection, urethritis related to STI, BV, appendicitis.  Patient's workup 7 days ago included CT scan without contrast showing significant cystitis, will obtain repeat imaging with contrast given patient continued symptoms despite antibiotics.  Past medical/surgical history that increases complexity of ED encounter: None  Interpretation of Diagnostics I personally reviewed the Laboratory Testing and my interpretation is as follows: No significant blood count or electrolyte disturbance.  CT showing continued cystitis, no other complicating features.  Patient Reassessment and Ultimate Disposition/Management     Patient feeling better on reassessment with normal vitals, no indication for further testing or admission, concern for continued UTI resistant to the ciprofloxacin  which patient completed.  Sending urine for culture, providing different course of antibiotics, a dose of IV ceftriaxone  prior to departure.  Patient  management required discussion with the following services or consulting groups:  None  Complexity of Problems Addressed Acute illness or injury that poses threat of life of bodily function  Additional Data Reviewed and Analyzed Further history obtained from: Prior ED visit notes and Recent Consult notes  Additional Factors Impacting ED Encounter Risk Consideration of hospitalization  Ozell HERO. Theadore, MD [facility] Emergency Medicine [facility] [email or web address]  Final Clinical Impressions(s) / ED Diagnoses     ICD-10-CM   1. Cystitis  N30.90       ED Discharge Orders          Ordered    cephALEXin  (KEFLEX ) 500 MG capsule  3 times daily  07/10/24 0115    sulfamethoxazole -trimethoprim  (BACTRIM  DS) 800-160 MG tablet  2 times daily        07/10/24 0115    phenazopyridine  (PYRIDIUM ) 200 MG tablet  3 times daily PRN        07/10/24 0115    fluconazole  (DIFLUCAN ) 150 MG tablet  Every 3 DAYS PRN        07/10/24 0115             Discharge Instructions Discussed with and Provided to Patient:     Discharge Instructions      You were evaluated in the Emergency Department and after careful evaluation, we did not find any emergent condition requiring admission or further testing in the hospital.  Your exam/testing today is overall reassuring.  Symptoms seem to be due to continued inflammation and likely infection of the bladder.  Recommend combined antibiotic therapy with Keflex  3 times a day and Bactrim  twice a day for the next week.  Can continue Tylenol  or Motrin  for discomfort.  Can use the Pyridium  as needed for discomfort, can use the Diflucan  as needed for yeast infection treatment or prevention.  Please return to the Emergency Department if you experience any worsening of your condition.   Thank you for allowing us  to be a part of your care.       Theadore Ozell HERO, MD 07/10/24 [phone]

## 2024-07-09 NOTE — Telephone Encounter (Signed)
 Pt still having symptoms, symptoms never fully went away. Sts she finished abx and is still having urinary frequency, painful urination, feels like she needs to bear down when urinating. Dr. Pauline notified.

## 2024-07-10 LAB — WET PREP, GENITAL
Clue Cells Wet Prep HPF POC: NONE SEEN
Sperm: NONE SEEN
Trich, Wet Prep: NONE SEEN
WBC, Wet Prep HPF POC: <10 (ref <10)
Yeast Wet Prep HPF POC: NONE SEEN

## 2024-07-10 MED ORDER — SULFAMETHOXAZOLE-TRIMETHOPRIM 800-160 MG PO TABS: 1.0000 | ORAL_TABLET | Freq: Two times a day (BID) | ORAL | 0 refills | Status: AC

## 2024-07-10 MED ORDER — CEPHALEXIN 500 MG PO CAPS: 500.0000 mg | ORAL_CAPSULE | Freq: Three times a day (TID) | ORAL | 0 refills | Status: AC

## 2024-07-10 MED ORDER — PHENAZOPYRIDINE HCL 200 MG PO TABS
200.0000 mg | ORAL_TABLET | Freq: Three times a day (TID) | ORAL | 0 refills | Status: AC | PRN
Start: 2024-07-10 — End: ?

## 2024-07-10 MED ORDER — FLUCONAZOLE 150 MG PO TABS: 150.0000 mg | ORAL_TABLET | ORAL | 0 refills | Status: AC | PRN

## 2024-07-10 MED ORDER — SODIUM CHLORIDE 0.9 % IV SOLN
1.0000 g | Freq: Once | INTRAVENOUS | Status: AC
  Administered 2024-07-10: 1 g via INTRAVENOUS
  Filled 2024-07-10: qty 10

## 2024-07-10 NOTE — Discharge Instructions (Signed)
 You were evaluated in the Emergency Department and after careful evaluation, we did not find any emergent condition requiring admission or further testing in the hospital.  Your exam/testing today is overall reassuring.  Symptoms seem to be due to continued inflammation and likely infection of the bladder.  Recommend combined antibiotic therapy with Keflex  3 times a day and Bactrim  twice a day for the next week.  Can continue Tylenol  or Motrin  for discomfort.  Can use the Pyridium  as needed for discomfort, can use the Diflucan  as needed for yeast infection treatment or prevention.  Please return to the Emergency Department if you experience any worsening of your condition.   Thank you for allowing us  to be a part of your care.

## 2024-07-12 LAB — URINE CULTURE: Culture: 20000 — AB

## 2024-07-13 ENCOUNTER — Telehealth (HOSPITAL_BASED_OUTPATIENT_CLINIC_OR_DEPARTMENT_OTHER): Payer: Self-pay | Admitting: *Deleted

## 2024-07-13 LAB — GC/CHLAMYDIA PROBE AMP (~~LOC~~) NOT AT ARMC
Comment: NEGATIVE
Comment: NORMAL

## 2024-07-13 NOTE — Telephone Encounter (Signed)
 Post ED Visit - Positive Culture Follow-up  Culture report reviewed by antimicrobial stewardship pharmacist: [facility] [x]  Koren Or, Pharm.D. []  Venetia Gully, Pharm.D., BCPS AQ-ID []  Garrel Crews, Pharm.D., BCPS []  Almarie Lunger, Pharm.D., BCPS []  [place], [address].D., BCPS, AAHIVP []  Rosaline Bihari, Pharm.D., BCPS, AAHIVP []  Vernell Meier, PharmD, BCPS []  Latanya Hint, PharmD, BCPS []  Donald Medley, PharmD, BCPS []  Rocky Bold, PharmD []  Dorothyann Alert, PharmD, BCPS []  Morene Babe, PharmD  [facility] []  Rosaline Edison, PharmD []  Romona Bliss, PharmD []  Dolphus Roller, PharmD []  Veva Seip, Rph []  Vernell Daunt) Leonce, PharmD []  Eva Allis, PharmD []  Rosaline Millet, PharmD []  Iantha Batch, PharmD []  Arvin Gauss, PharmD []  Wanda Hasting, PharmD []  Ronal Rav, PharmD []  Rocky Slade, PharmD []  Bard Jeans, PharmD   Positive urine culture Treated with cephalexin ,  fluconazole , and sulfamethoxazole  organism sensitive to the same and no further patient follow-up is required at this time.  Lauren Lawson 07/13/2024, 9:57 AM
